# Patient Record
Sex: Female | Born: 1960 | Race: White | Hispanic: No | Marital: Single
Health system: Midwestern US, Community
[De-identification: ages and names within clinical notes are randomized; demographics above are authoritative.]

## PROBLEM LIST (undated history)

## (undated) DIAGNOSIS — R55 Syncope and collapse: Secondary | ICD-10-CM

## (undated) DIAGNOSIS — I7300 Raynaud's syndrome without gangrene: Secondary | ICD-10-CM

## (undated) DIAGNOSIS — K219 Gastro-esophageal reflux disease without esophagitis: Secondary | ICD-10-CM

## (undated) DIAGNOSIS — E079 Disorder of thyroid, unspecified: Secondary | ICD-10-CM

---
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
## 1988-07-08 HISTORY — PX: TUBAL LIGATION: SHX77

## 1998-07-08 HISTORY — PX: ABDOMINAL HYSTERECTOMY: SHX81

## 2013-04-21 ENCOUNTER — Encounter (HOSPITAL_COMMUNITY): Payer: Self-pay | Admitting: Emergency Medicine

## 2013-04-21 ENCOUNTER — Emergency Department (INDEPENDENT_AMBULATORY_CARE_PROVIDER_SITE_OTHER)
Admission: EM | Admit: 2013-04-21 | Discharge: 2013-04-21 | Disposition: A | Discharge status: Home / Self Care | Payer: Self-pay | Source: Home / Self Care | Attending: Emergency Medicine | Admitting: Emergency Medicine

## 2013-04-21 ENCOUNTER — Other Ambulatory Visit (HOSPITAL_COMMUNITY)
Admission: RE | Admit: 2013-04-21 | Discharge: 2013-04-21 | Disposition: A | Discharge status: Home / Self Care | Payer: Self-pay | Source: Ambulatory Visit | Attending: Emergency Medicine | Admitting: Emergency Medicine

## 2013-04-21 DIAGNOSIS — N760 Acute vaginitis: Secondary | ICD-10-CM

## 2013-04-21 DIAGNOSIS — J209 Acute bronchitis, unspecified: Secondary | ICD-10-CM

## 2013-04-21 DIAGNOSIS — Z113 Encounter for screening for infections with a predominantly sexual mode of transmission: Secondary | ICD-10-CM | POA: Insufficient documentation

## 2013-04-21 DIAGNOSIS — J019 Acute sinusitis, unspecified: Secondary | ICD-10-CM

## 2013-04-21 DIAGNOSIS — J069 Acute upper respiratory infection, unspecified: Secondary | ICD-10-CM

## 2013-04-21 DIAGNOSIS — F17210 Nicotine dependence, cigarettes, uncomplicated: Secondary | ICD-10-CM

## 2013-04-21 MED ORDER — GUAIFENESIN-CODEINE 100-10 MG/5ML PO SYRP: 10.0000 mL | ORAL_SOLUTION | Freq: Four times a day (QID) | ORAL | Status: DC | PRN

## 2013-04-21 MED ORDER — AMOXICILLIN 500 MG PO CAPS: 1000.0000 mg | ORAL_CAPSULE | Freq: Three times a day (TID) | ORAL | Status: DC

## 2013-04-21 MED ORDER — METRONIDAZOLE 500 MG PO TABS: 500.0000 mg | ORAL_TABLET | Freq: Two times a day (BID) | ORAL | Status: DC

## 2013-04-21 MED ORDER — FLUCONAZOLE 150 MG PO TABS: 150.0000 mg | ORAL_TABLET | Freq: Once | ORAL | Status: DC

## 2013-04-21 NOTE — ED Provider Notes (Signed)
Chief Complaint:   Chief Complaint  Patient presents with  . Sinusitis  . Vaginitis    History of Present Illness:   Monica Wilkinson is a 52 year old female who presents with cough and vaginal discharge.  1. Cough: She's had a two-day history of cough productive yellow-green sputum, wheezing, aching in her upper back. She is a smoker. No history of asthma. She's also had sore throat, chills, nasal congestion with yellow drainage, headache, sinus pressure, and nausea. She denies any fever, vomiting, or diarrhea. No known sick exposures.  2. Vaginal discharge: She's also had a one-week history of vaginal discharge, slight itching, and slight odor. She's status post hysterectomy. Denies any pelvic pain.  Review of Systems:  Other than noted above, the patient denies any of the following symptoms: Systemic:  No fever, chills, sweats, or weight loss. GI:  No abdominal pain, nausea, anorexia, vomiting, diarrhea, constipation, melena or hematochezia. GU:  No dysuria, frequency, urgency, hematuria, vaginal discharge, itching, or abnormal vaginal bleeding. Skin:  No rash or itching.  PMFSH:  Past medical history, family history, social history, meds, and allergies were reviewed.  She takes Prilosec and Synthroid and has a history of gastroesophageal reflux and hypothyroidism.  Physical Exam:   Vital signs:  BP 118/50  Pulse 86  Temp(Src) 97.6 F (36.4 C) (Oral)  Resp 18  SpO2 100% General:  Alert, oriented and in no distress. HEENT: TMs are normal. Nasal mucosa was normal. Pharynx was erythematous with some cobblestoning no exudate or drainage. No cervical lymphadenopathy. Lungs:  Breath sounds clear and equal bilaterally.  No wheezes, rales or rhonchi. Heart:  Regular rhythm.  No gallops or murmers. Abdomen:  Soft, flat and non-distended.  No organomegaly or mass.  No tenderness, guarding or rebound.  Bowel sounds normally active. Pelvic exam:  Normal external genitalia. Vaginal mucosa was  normal. There was a small amount of white, malodorous discharge. Cervix and uterus were surgically absent. No adnexal masses or tenderness. DNA probe for gonorrhea, Chlamydia, Trichomonas, Gardnerella, Candida were obtained. Skin:  Clear, warm and dry.  Assessment:  The primary encounter diagnosis was Acute bronchitis. Diagnoses of Acute sinusitis, Viral URI, Cigarette smoker, and Vaginitis were also pertinent to this visit.   she was strongly encouraged to quit smoking.   Plan:   1.  Meds:  The following meds were prescribed:   Discharge Medication List as of 04/21/2013  2:03 PM    START taking these medications   Details  amoxicillin (AMOXIL) 500 MG capsule Take 2 capsules (1,000 mg total) by mouth 3 (three) times daily., Starting 04/21/2013, Until Discontinued, Normal    fluconazole (DIFLUCAN) 150 MG tablet Take 1 tablet (150 mg total) by mouth once., Starting 04/21/2013, Normal    guaiFENesin-codeine (GUIATUSS AC) 100-10 MG/5ML syrup Take 10 mLs by mouth 4 (four) times daily as needed for cough., Starting 04/21/2013, Until Discontinued, Print    metroNIDAZOLE (FLAGYL) 500 MG tablet Take 1 tablet (500 mg total) by mouth 2 (two) times daily., Starting 04/21/2013, Until Discontinued, Normal        2.  Patient Education/Counseling:  The patient was given appropriate handouts, self care instructions, and instructed in symptomatic relief.  Strongly encouraged to quit smoking and given handouts about how to quit.   3.  Follow up:  The patient was told to follow up if no better in 3 to 4 days, if becoming worse in any way, and given some red flag symptoms such as difficulty breathing or fever which would  prompt immediate return.  Follow up here as needed.     Reuben Likes, MD 04/21/13 [phone]

## 2013-04-21 NOTE — ED Notes (Signed)
C/o sinus issues which started yesterday and yeast infection a few days ago.  No treatments tried for either issue.

## 2013-04-23 NOTE — ED Notes (Signed)
GC/Chlamydia neg., Affirm: Candida neg., Gardnerella and Trich pos. Pt. Adequately treated with Flagyl.  Needs notified. Monica Wilkinson 04/23/2013

## 2013-04-24 ENCOUNTER — Telehealth (HOSPITAL_COMMUNITY): Payer: Self-pay | Admitting: *Deleted

## 2013-04-24 NOTE — ED Notes (Signed)
I called pt. and left a message to call. Monica Wilkinson 04/24/2013

## 2013-04-26 ENCOUNTER — Telehealth (HOSPITAL_COMMUNITY): Payer: Self-pay | Admitting: *Deleted

## 2013-04-26 NOTE — ED Notes (Signed)
I called and left a message to call. Call 2. Vassie Moselle 04/26/2013

## 2013-04-27 NOTE — ED Notes (Signed)
Pt. called back.  Pt. verified x 2 and given results.  Pt. told she was adequately treated for both infections with Flagyl.  Pt. instructed to notify her partner to be treated with Flagyl, no sex until you have finished your medication and your partner has been treated and to practice safe sex. Pt. asked where he can be treated. I told her if he does not have a PCP, he can come here.  Pt. voiced understanding. Vassie Moselle 04/27/2013

## 2014-01-18 ENCOUNTER — Emergency Department (HOSPITAL_COMMUNITY)
Admission: EM | Admit: 2014-01-18 | Discharge: 2014-01-18 | Disposition: A | Discharge status: Home / Self Care | Payer: Self-pay | Attending: Emergency Medicine | Admitting: Emergency Medicine

## 2014-01-18 ENCOUNTER — Encounter (HOSPITAL_COMMUNITY): Payer: Self-pay | Admitting: Emergency Medicine

## 2014-01-18 ENCOUNTER — Emergency Department (HOSPITAL_COMMUNITY): Payer: Self-pay

## 2014-01-18 DIAGNOSIS — R059 Cough, unspecified: Secondary | ICD-10-CM

## 2014-01-18 DIAGNOSIS — Z9089 Acquired absence of other organs: Secondary | ICD-10-CM | POA: Insufficient documentation

## 2014-01-18 DIAGNOSIS — M25579 Pain in unspecified ankle and joints of unspecified foot: Secondary | ICD-10-CM | POA: Insufficient documentation

## 2014-01-18 DIAGNOSIS — E079 Disorder of thyroid, unspecified: Secondary | ICD-10-CM | POA: Insufficient documentation

## 2014-01-18 DIAGNOSIS — R600 Localized edema: Secondary | ICD-10-CM

## 2014-01-18 DIAGNOSIS — Z90710 Acquired absence of both cervix and uterus: Secondary | ICD-10-CM | POA: Insufficient documentation

## 2014-01-18 DIAGNOSIS — Z79899 Other long term (current) drug therapy: Secondary | ICD-10-CM | POA: Insufficient documentation

## 2014-01-18 DIAGNOSIS — F17200 Nicotine dependence, unspecified, uncomplicated: Secondary | ICD-10-CM | POA: Insufficient documentation

## 2014-01-18 DIAGNOSIS — Y929 Unspecified place or not applicable: Secondary | ICD-10-CM | POA: Insufficient documentation

## 2014-01-18 DIAGNOSIS — A5901 Trichomonal vulvovaginitis: Secondary | ICD-10-CM

## 2014-01-18 DIAGNOSIS — R05 Cough: Secondary | ICD-10-CM | POA: Insufficient documentation

## 2014-01-18 DIAGNOSIS — X58XXXA Exposure to other specified factors, initial encounter: Secondary | ICD-10-CM | POA: Insufficient documentation

## 2014-01-18 DIAGNOSIS — R609 Edema, unspecified: Secondary | ICD-10-CM | POA: Insufficient documentation

## 2014-01-18 DIAGNOSIS — R109 Unspecified abdominal pain: Secondary | ICD-10-CM

## 2014-01-18 DIAGNOSIS — Y939 Activity, unspecified: Secondary | ICD-10-CM | POA: Insufficient documentation

## 2014-01-18 DIAGNOSIS — Z9851 Tubal ligation status: Secondary | ICD-10-CM | POA: Insufficient documentation

## 2014-01-18 DIAGNOSIS — IMO0002 Reserved for concepts with insufficient information to code with codable children: Secondary | ICD-10-CM | POA: Insufficient documentation

## 2014-01-18 HISTORY — DX: Disorder of thyroid, unspecified: E07.9

## 2014-01-18 LAB — CBC WITH DIFFERENTIAL/PLATELET
Basophils Absolute: 0 10*3/uL (ref 0.0–0.1)
Basophils Relative: 1 % (ref 0–1)
EOS PCT: 4 % (ref 0–5)
Eosinophils Absolute: 0.2 10*3/uL (ref 0.0–0.7)
HEMATOCRIT: 38.1 % (ref 36.0–46.0)
HEMOGLOBIN: 12.4 g/dL (ref 12.0–15.0)
LYMPHS PCT: 43 % (ref 12–46)
Lymphs Abs: 2.4 10*3/uL (ref 0.7–4.0)
MCH: 31.1 pg (ref 26.0–34.0)
MCHC: 32.5 g/dL (ref 30.0–36.0)
MCV: 95.5 fL (ref 78.0–100.0)
MONO ABS: 0.5 10*3/uL (ref 0.1–1.0)
MONOS PCT: 9 % (ref 3–12)
Neutro Abs: 2.4 10*3/uL (ref 1.7–7.7)
Neutrophils Relative %: 43 % (ref 43–77)
Platelets: 202 10*3/uL (ref 150–400)
RBC: 3.99 MIL/uL (ref 3.87–5.11)
RDW: 12.3 % (ref 11.5–15.5)
WBC: 5.5 10*3/uL (ref 4.0–10.5)

## 2014-01-18 LAB — URINALYSIS, ROUTINE W REFLEX MICROSCOPIC
BILIRUBIN URINE: NEGATIVE
GLUCOSE, UA: NEGATIVE mg/dL
Hgb urine dipstick: NEGATIVE
KETONES UR: NEGATIVE mg/dL
Nitrite: NEGATIVE
PH: 6.5 (ref 5.0–8.0)
Protein, ur: NEGATIVE mg/dL
SPECIFIC GRAVITY, URINE: 1.016 (ref 1.005–1.030)
Urobilinogen, UA: 0.2 mg/dL (ref 0.0–1.0)

## 2014-01-18 LAB — BASIC METABOLIC PANEL
Anion gap: 14 (ref 5–15)
BUN: 9 mg/dL (ref 6–23)
CALCIUM: 9.8 mg/dL (ref 8.4–10.5)
CHLORIDE: 103 meq/L (ref 96–112)
CO2: 26 meq/L (ref 19–32)
Creatinine, Ser: 0.68 mg/dL (ref 0.50–1.10)
GFR calc Af Amer: >90 mL/min (ref >90)
GFR calc non Af Amer: >90 mL/min (ref >90)
GLUCOSE: 94 mg/dL (ref 70–99)
Potassium: 3.8 mEq/L (ref 3.7–5.3)
Sodium: 143 mEq/L (ref 137–147)

## 2014-01-18 LAB — WET PREP, GENITAL
Clue Cells Wet Prep HPF POC: NONE SEEN
Yeast Wet Prep HPF POC: NONE SEEN

## 2014-01-18 LAB — URINE MICROSCOPIC-ADD ON

## 2014-01-18 MED ORDER — LIDOCAINE HCL 2 % IJ SOLN
INTRAMUSCULAR | Status: AC
  Filled 2014-01-18: qty 20

## 2014-01-18 MED ORDER — AZITHROMYCIN 250 MG PO TABS
1000.0000 mg | ORAL_TABLET | Freq: Once | ORAL | Status: AC
  Administered 2014-01-18: 1000 mg via ORAL
  Filled 2014-01-18: qty 4

## 2014-01-18 MED ORDER — CEFTRIAXONE SODIUM 250 MG IJ SOLR
250.0000 mg | Freq: Once | INTRAMUSCULAR | Status: AC
  Administered 2014-01-18: 250 mg via INTRAMUSCULAR
  Filled 2014-01-18: qty 250

## 2014-01-18 MED ORDER — METRONIDAZOLE 500 MG PO TABS
2000.0000 mg | ORAL_TABLET | Freq: Once | ORAL | Status: AC
  Administered 2014-01-18: 2000 mg via ORAL
  Filled 2014-01-18: qty 4

## 2014-01-18 NOTE — ED Notes (Signed)
Pt c/o increasing bilateral foot swelling x 2 and vaginal discharge x 1 week.  Pain score 7/10.  Denies injury to feet.  Sts discharge is "yellowish" with a slight odor.  Pt reports "the only time I've had my feet swell like this was when I had a really bad sunburn."

## 2014-01-18 NOTE — Discharge Instructions (Signed)
Avoid tobacco use  Avoid sexual contact until your sexual partner is treated and you know your results  Elevate your lower extremities and obtain US tomorrow at [facility]  Return to the emergency department if you develop any changing/worsening condition, worsening abdominal pain, repeated vomiting, difficulty breathing, coughing up blood, fever or any other concerns (please read additional information regarding your condition below)   Trichomonas Test This is a test used to diagnose an infection with Trichomonas vaginalis. This is a sexually transmitted, microscopic parasite that causes vaginal infections in women and urethritis in some men. This test is often done if there is vaginal discharge or pain on urination. If you have an infection with another sexually transmitted disease, your caregiver might test for trichomonas as well. Secretions or a sample are collected on a swab and are examined under a microscope or cultured to detect the presence of Trichomonas vaginalis. Trichomonas is one of the most common sexually transmitted diseases. An infected person is at greater risk of getting other sexually transmitted diseases, so your caregiver may want to test for these other infections also.  Trichomonas infection can affect pregnancy, contributing to premature birth and low birth weight. You should inform your physician if you may be pregnant. The doctor may medically manage a woman who is infected and in her first three months of pregnancy differently.  SPECIMEN COLLECTION In women, a swab of secretions is collected from the vagina. In men, a swab is inserted into the urethra. MEANING OF TEST  A positive test indicates an active infection that requires treatment with antibiotics. It is usually treated with an antibiotic called metronidazole. All current sexual partners must be treated at the same time or the patient is likely to become re-infected. The Center for Disease Control (CDC) recommends  the following guidelines to avoid infection or reinfection:   Abstain from sexual intercourse  Use a latex condom properly, every time you have sexual intercourse, with every partner.  Limit your sexual partners. The more sex partners you have, the greater your risk of encountering someone who has this or other STI's.  If you are infected, your sexual partner(s) should be treated. This will prevent you from getting reinfected. OBTAINING THE TEST RESULTS It is your responsibility to obtain your test results. Ask the lab or department performing the test when and how you will get your results. Document Released: 07/27/2004 Document Revised: 10/19/2012 Document Reviewed: 04/03/2005 [facility] Patient Information 2015 [place], [place]. This information is not intended to replace advice given to you by your health care provider. Make sure you discuss any questions you have with your health care provider.   Abdominal Pain, Women Abdominal (stomach, pelvic, or belly) pain can be caused by many things. It is important to tell your doctor:  The location of the pain.  Does it come and go or is it present all the time?  Are there things that start the pain (eating certain foods, exercise)?  Are there other symptoms associated with the pain (fever, nausea, vomiting, diarrhea)? All of this is helpful to know when trying to find the cause of the pain. CAUSES   Stomach: virus or bacteria infection, or ulcer.  Intestine: appendicitis (inflamed appendix), regional ileitis (Crohn's disease), ulcerative colitis (inflamed colon), irritable bowel syndrome, diverticulitis (inflamed diverticulum of the colon), or cancer of the stomach or intestine.  Gallbladder disease or stones in the gallbladder.  Kidney disease, kidney stones, or infection.  Pancreas infection or cancer.  Fibromyalgia (pain disorder).  Diseases of the female organs:  Uterus: fibroid (non-cancerous) tumors or  infection.  Fallopian tubes: infection or tubal pregnancy.  Ovary: cysts or tumors.  Pelvic adhesions (scar tissue).  Endometriosis (uterus lining tissue growing in the pelvis and on the pelvic organs).  Pelvic congestion syndrome (female organs filling up with blood just before the menstrual period).  Pain with the menstrual period.  Pain with ovulation (producing an egg).  Pain with an IUD (intrauterine device, birth control) in the uterus.  Cancer of the female organs.  Functional pain (pain not caused by a disease, may improve without treatment).  Psychological pain.  Depression. DIAGNOSIS  Your doctor will decide the seriousness of your pain by doing an examination.  Blood tests.  X-rays.  Ultrasound.  CT scan (computed tomography, special type of X-ray).  MRI (magnetic resonance imaging).  Cultures, for infection.  Barium enema (dye inserted in the large intestine, to better view it with X-rays).  Colonoscopy (looking in intestine with a lighted tube).  Laparoscopy (minor surgery, looking in abdomen with a lighted tube).  Major abdominal exploratory surgery (looking in abdomen with a large incision). TREATMENT  The treatment will depend on the cause of the pain.   Many cases can be observed and treated at home.  Over-the-counter medicines recommended by your caregiver.  Prescription medicine.  Antibiotics, for infection.  Birth control pills, for painful periods or for ovulation pain.  Hormone treatment, for endometriosis.  Nerve blocking injections.  Physical therapy.  Antidepressants.  Counseling with a psychologist or psychiatrist.  Minor or major surgery. HOME CARE INSTRUCTIONS   Do not take laxatives, unless directed by your caregiver.  Take over-the-counter pain medicine only if ordered by your caregiver. Do not take aspirin because it can cause an upset stomach or bleeding.  Try a clear liquid diet (broth or water) as ordered  by your caregiver. Slowly move to a bland diet, as tolerated, if the pain is related to the stomach or intestine.  Have a thermometer and take your temperature several times a day, and record it.  Bed rest and sleep, if it helps the pain.  Avoid sexual intercourse, if it causes pain.  Avoid stressful situations.  Keep your follow-up appointments and tests, as your caregiver orders.  If the pain does not go away with medicine or surgery, you may try:  Acupuncture.  Relaxation exercises (yoga, meditation).  Group therapy.  Counseling. SEEK MEDICAL CARE IF:   You notice certain foods cause stomach pain.  Your home care treatment is not helping your pain.  You need stronger pain medicine.  You want your IUD removed.  You feel faint or lightheaded.  You develop nausea and vomiting.  You develop a rash.  You are having side effects or an allergy to your medicine. SEEK IMMEDIATE MEDICAL CARE IF:   Your pain does not go away or gets worse.  You have a fever.  Your pain is felt only in portions of the abdomen. The right side could possibly be appendicitis. The left lower portion of the abdomen could be colitis or diverticulitis.  You are passing blood in your stools (bright red or black tarry stools, with or without vomiting).  You have blood in your urine.  You develop chills, with or without a fever.  You pass out. MAKE SURE YOU:   Understand these instructions.  Will watch your condition.  Will get help right away if you are not doing well or get worse. Document Released: 04/21/2007 Document  Revised: 09/16/2011 Document Reviewed: 05/11/2009 [employer] Patient Information 2015 [place], [place]. This information is not intended to replace advice given to you by your health care provider. Make sure you discuss any questions you have with your health care provider.  Peripheral Edema You have swelling in your legs (peripheral edema). This swelling is due to excess  accumulation of salt and water in your body. Edema may be a sign of heart, kidney or liver disease, or a side effect of a medication. It may also be due to problems in the leg veins. Elevating your legs and using special support stockings may be very helpful, if the cause of the swelling is due to poor venous circulation. Avoid long periods of standing, whatever the cause. Treatment of edema depends on identifying the cause. Chips, pretzels, pickles and other salty foods should be avoided. Restricting salt in your diet is almost always needed. Water pills (diuretics) are often used to remove the excess salt and water from your body via urine. These medicines prevent the kidney from reabsorbing sodium. This increases urine flow. Diuretic treatment may also result in lowering of potassium levels in your body. Potassium supplements may be needed if you have to use diuretics daily. Daily weights can help you keep track of your progress in clearing your edema. You should call your caregiver for follow up care as recommended. SEEK IMMEDIATE MEDICAL CARE IF:   You have increased swelling, pain, redness, or heat in your legs.  You develop shortness of breath, especially when lying down.  You develop chest or abdominal pain, weakness, or fainting.  You have a fever. Document Released: 08/01/2004 Document Revised: 09/16/2011 Document Reviewed: 07/12/2009 [facility] Patient Information 2015 [place], [place]. This information is not intended to replace advice given to you by your health care provider. Make sure you discuss any questions you have with your health care provider.  Cough, Adult  A cough is a reflex that helps clear your throat and airways. It can help heal the body or may be a reaction to an irritated airway. A cough may only last 2 or 3 weeks (acute) or may last more than 8 weeks (chronic).  CAUSES Acute cough:  Viral or bacterial infections. Chronic  cough:  Infections.  Allergies.  Asthma.  Post-nasal drip.  Smoking.  Heartburn or acid reflux.  Some medicines.  Chronic lung problems (COPD).  Cancer. SYMPTOMS   Cough.  Fever.  Chest pain.  Increased breathing rate.  High-pitched whistling sound when breathing (wheezing).  Colored mucus that you cough up (sputum). TREATMENT   A bacterial cough may be treated with antibiotic medicine.  A viral cough must run its course and will not respond to antibiotics.  Your caregiver may recommend other treatments if you have a chronic cough. HOME CARE INSTRUCTIONS   Only take over-the-counter or prescription medicines for pain, discomfort, or fever as directed by your caregiver. Use cough suppressants only as directed by your caregiver.  Use a cold steam vaporizer or humidifier in your bedroom or home to help loosen secretions.  Sleep in a semi-upright position if your cough is worse at night.  Rest as needed.  Stop smoking if you smoke. SEEK IMMEDIATE MEDICAL CARE IF:   You have pus in your sputum.  Your cough starts to worsen.  You cannot control your cough with suppressants and are losing sleep.  You begin coughing up blood.  You have difficulty breathing.  You develop pain which is getting worse or is uncontrolled with medicine.  You have a fever. MAKE SURE YOU:   Understand these instructions.  Will watch your condition.  Will get help right away if you are not doing well or get worse. Document Released: 12/21/2010 Document Revised: 09/16/2011 Document Reviewed: 12/21/2010 [facility] Patient Information 2015 [place]. This information is not intended to replace advice given to you by your health care provider. Make sure you discuss any questions you have with your health care provider.

## 2014-01-18 NOTE — ED Notes (Signed)
Pelvic setup at bedside.

## 2014-01-18 NOTE — ED Provider Notes (Signed)
CSN: [id number]     Arrival date & time 01/18/14  1752 History   First MD Initiated Contact with Patient 01/18/14 1903     Chief Complaint  Patient presents with  . Foot Swelling  . Vaginal Discharge   HPI  Monica Wilkinson is a 53 y.o. female with a PMH of thyroid disease who presents to the ED for evaluation of foot swelling and vaginal discharge.   History was provided by the patient. Patient states she has had bilateral foot swelling for the past 2 days (L>R). 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 She also complains of a deep pain, which radiates up into her calves. No weakness, loss of sensation, numbness/tingling, skin changes. Has abrasion to left back heel with no discharge, bleeding, or open wounds. No history of DVT, PE, clotting disorder, cancer, recent surgery, immobilization, travel, or estrogen supplementation. Patient also complains of a non-productive cough for the past two weeks. No chest pain, wheezing or SOB. She states she feels congested. Patient is a tobacco user.   Patient also complains of yellow vaginal discharge for the past week. She also reports a slight odor. Has hx of abdominal hysterectomy. Also has had intermittent cramping mild lower middle pelvic pain, which radiates to her lower back. She denies any dysuria, hematuria, diarrhea, constipation, emesis, nausea, fevers, chills, change in appetite/activity. No new sexual partners. No concerns for STD's.    Past Medical History  Diagnosis Date  . Thyroid disease    Past Surgical History  Procedure Laterality Date  . Cholecystectomy    . Ovarian cyst drainage    . Abdominal hysterectomy    . Tubal ligation     History reviewed. No pertinent family history. History  Substance Use Topics  . Smoking status: Current Every Day Smoker -- 0.50 packs/day     Types: Cigarettes  . Smokeless tobacco: Not on file  . Alcohol Use: Yes     Comment: occ   OB History   Grav Para Term Preterm Abortions TAB SAB Ect Mult Living                 Review of Systems  Constitutional: Negative for fever, chills, diaphoresis, activity change, appetite change and fatigue.  HENT: Negative for congestion, rhinorrhea and sore throat.   Respiratory: Positive for cough. Negative for chest tightness, shortness of breath and wheezing.   Cardiovascular: Positive for leg swelling. Negative for chest pain and palpitations.  Gastrointestinal: Positive for abdominal pain. Negative for nausea, vomiting, diarrhea and constipation.  Genitourinary: Positive for vaginal discharge and pelvic pain. Negative for dysuria, urgency, hematuria, flank pain, decreased urine volume, vaginal bleeding, difficulty urinating and vaginal pain.  Musculoskeletal: Positive for arthralgias (feet bilaterally), back pain and joint swelling (left ankle). Negative for gait problem, myalgias and neck pain.  Skin: Positive for wound. Negative for color change.  Neurological: Negative for dizziness, weakness, light-headedness, numbness and headaches.    Allergies  Review of patient's allergies indicates no known allergies.  Home Medications   Prior to Admission medications   Medication Sig Start Date End Date Taking? Authorizing Provider  levothyroxine (SYNTHROID, LEVOTHROID) 88 MCG tablet Take 88 mcg by mouth daily before breakfast.   Yes Historical Provider, MD   BP 114/75  Pulse 75  Temp(Src) 98.7 F (37.1 C) (Oral)  Resp 16  SpO2 100%  Filed Vitals:   01/18/14 1825 01/18/14 2205  BP: 114/75 118/61  Pulse: 75 72  Temp: 98.7 F (37.1 C) 99.1 F (37.3 C)  TempSrc: Oral Oral  Resp: 16 16  SpO2: 100% 100%    Physical Exam  Nursing note and vitals reviewed. Constitutional: She is oriented to person, place, and time. She appears well-developed and well-nourished. No distress.  HENT:   Head: Normocephalic and atraumatic.  Right Ear: External ear normal.  Left Ear: External ear normal.  Nose: Nose normal.  Mouth/Throat: Oropharynx is clear and moist. No oropharyngeal exudate.  Eyes: Conjunctivae are normal. Right eye exhibits no discharge. Left eye exhibits no discharge.  Neck: Normal range of motion. Neck supple.  Cardiovascular: Normal rate, regular rhythm, normal heart sounds and intact distal pulses.  Exam reveals no gallop and no friction rub.   No murmur heard. Dorsalis pedis pulses present and equal bilaterally  Pulmonary/Chest: Effort normal and breath sounds normal. No respiratory distress. She has no wheezes. She has no rales. She exhibits no tenderness.  Abdominal: Soft. Bowel sounds are normal. She exhibits no distension. There is no tenderness. There is no rebound and no guarding.  Genitourinary:  Thick yellow moderate amount of vaginal discharge present in the vaginal vault. Cervix not visualized. No palpable masses or tenderness on bimanual exam. Normal external genitalia. No genital sores.   Musculoskeletal: Normal range of motion. She exhibits edema and tenderness.  Pitting 1+ edema to the left lateral ankle and dorsum of foot with no ecchymosis, erythema, warmth or wounds. ROM intact in the LE. No tenderness to palpation in the LE throughout. No calf tenderness or edema bilaterally. Mild non-pitting edema to the right foot.   Neurological: She is alert and oriented to person, place, and time.  Sensation intact in the LE   Skin: Skin is warm and dry. She is not diaphoretic.  Abrasion to the left posterior heel with no surrounding edema or erythema. No open wound or drainage.      ED Course  Procedures (including critical care time) Labs Review Labs Reviewed - No data to display  Imaging Review Dg Chest 2 View  01/18/2014   CLINICAL DATA:  Shortness of breath  EXAM: CHEST  2 VIEW  COMPARISON:  07/06/2013  FINDINGS: Cardiac shadow is within normal  limits. Bilateral breast implants are again noted. The lungs are hyperinflated consistent with COPD. Nipple shadows are noted bilaterally. No acute bony abnormality is seen.  IMPRESSION: No acute abnormality noted.   Electronically Signed   By: Alcide Clever M.D.   On: 01/18/2014 20:28   Dg Ankle Complete Left  01/18/2014   CLINICAL DATA:  Left foot and ankle pain following injury  EXAM: LEFT ANKLE COMPLETE - 3+ VIEW  COMPARISON:  None.  FINDINGS: Generalized soft tissue swelling is noted. There is a well corticated density adjacent to the medial malleolus consistent with prior trauma. No acute fracture or dislocation is seen.  IMPRESSION: Soft tissue changes without acute abnormality.   Electronically Signed   By: Alcide Clever M.D.   On: 01/18/2014 20:27   Dg Foot Complete Left  01/18/2014   CLINICAL DATA:  Swelling, no injury.  EXAM: LEFT FOOT - COMPLETE 3+ VIEW  COMPARISON:  None.  FINDINGS: No acute fracture deformity or dislocation. Tiny corticated fragment inferior to the medial malleolus likely reflects remote injury. Os perineum. Joint space intact without erosions. No destructive bony lesions. Mild soft tissue swelling without subcutaneous gas or  radiopaque foreign bodies.  IMPRESSION: Soft tissue swelling could reflect cellulitis without fracture deformity or dislocation.   Electronically Signed   By: Awilda Metroourtnay  Bloomer   On: 01/18/2014 20:28     EKG Interpretation None      Results for orders placed during the hospital encounter of 01/18/14  WET PREP, GENITAL      Result Value Ref Range   Yeast Wet Prep HPF POC NONE SEEN  NONE SEEN   Trich, Wet Prep FEW (*) NONE SEEN   Clue Cells Wet Prep HPF POC NONE SEEN  NONE SEEN   WBC, Wet Prep HPF POC TOO NUMEROUS TO COUNT (*) NONE SEEN  CBC WITH DIFFERENTIAL      Result Value Ref Range   WBC 5.5  4.0 - 10.5 K/uL   RBC 3.99  3.87 - 5.11 MIL/uL   Hemoglobin 12.4  12.0 - 15.0 g/dL   HCT 11.938.1  14.736.0 - 82.946.0 %   MCV 95.5  78.0 - 100.0 fL   MCH  31.1  26.0 - 34.0 pg   MCHC 32.5  30.0 - 36.0 g/dL   RDW 56.212.3  13.011.5 - 86.515.5 %   Platelets 202  150 - 400 K/uL   Neutrophils Relative % 43  43 - 77 %   Neutro Abs 2.4  1.7 - 7.7 K/uL   Lymphocytes Relative 43  12 - 46 %   Lymphs Abs 2.4  0.7 - 4.0 K/uL   Monocytes Relative 9  3 - 12 %   Monocytes Absolute 0.5  0.1 - 1.0 K/uL   Eosinophils Relative 4  0 - 5 %   Eosinophils Absolute 0.2  0.0 - 0.7 K/uL   Basophils Relative 1  0 - 1 %   Basophils Absolute 0.0  0.0 - 0.1 K/uL  BASIC METABOLIC PANEL      Result Value Ref Range   Sodium 143  137 - 147 mEq/L   Potassium 3.8  3.7 - 5.3 mEq/L   Chloride 103  96 - 112 mEq/L   CO2 26  19 - 32 mEq/L   Glucose, Bld 94  70 - 99 mg/dL   BUN 9  6 - 23 mg/dL   Creatinine, Ser 7.840.68  0.50 - 1.10 mg/dL   Calcium 9.8  8.4 - 69.610.5 mg/dL   GFR calc non Af Amer >90  >90 mL/min   GFR calc Af Amer >90  >90 mL/min   Anion gap 14  5 - 15  URINALYSIS, ROUTINE W REFLEX MICROSCOPIC      Result Value Ref Range   Color, Urine YELLOW  YELLOW   APPearance CLEAR  CLEAR   Specific Gravity, Urine 1.016  1.005 - 1.030   pH 6.5  5.0 - 8.0   Glucose, UA NEGATIVE  NEGATIVE mg/dL   Hgb urine dipstick NEGATIVE  NEGATIVE   Bilirubin Urine NEGATIVE  NEGATIVE   Ketones, ur NEGATIVE  NEGATIVE mg/dL   Protein, ur NEGATIVE  NEGATIVE mg/dL   Urobilinogen, UA 0.2  0.0 - 1.0 mg/dL   Nitrite NEGATIVE  NEGATIVE   Leukocytes, UA LARGE (*) NEGATIVE  URINE MICROSCOPIC-ADD ON      Result Value Ref Range   Squamous Epithelial / LPF RARE  RARE   WBC, UA 3-6  <3 WBC/hpf   RBC / HPF 0-2  <3 RBC/hpf   Bacteria, UA RARE  RARE     MDM   Monica Wilkinson is a 53 y.o. female with a PMH of thyroid  disease who presents to the ED for evaluation of foot swelling and vaginal discharge.   Vaginal discharge likely due to trichomonas infection. Treated patient with 2 grams flagyl in the ED. Does not have insurance or money to pay for medications. Patient also treated with Azithromycin and  Rocephin. STD testing pending. Also complained of intermittent middle lower abdominal pain. Abdominal exam benign with no tenderness on exam. Pelvic exam unremarkable. Patient instructed to follow-up with women's health. Advised to void sexual intercourse. UA not highly suggestive of a UTI.   Patient also complained of bilateral leg edema with the left worse than the right. Swelling may be due to increase in recent activity. Patient neurovascularly intact. X-rays negative for fracture or malalignment. No evidence of cellulitis with warmth or redness. Patient afebrile and non-toxic in appearance. No leukocytosis. Although patient has no risks factors for this, DVT cannot be excluded. Bilateral US ordered. Suspecion for this is low, however, patient instructed to return to the ED [facility]) for this tomorrow. Patient also complained of a cough. Likely chronic from tobacco use. Chest x-ray negative for an acute cardiopulmonary process, however, does show hyperinflation consistent with COPD. No chest pain or SOB. No hypoxia, respiratory distress, or tachypnea. Patient instructed to avoid tobacco smoke and follow-up with her PCP. Return precautions, discharge instructions, and follow-up was discussed with the patient before discharge.     Discharge Medication List as of 01/18/2014 10:33 PM      Final impressions: 1. Leg edema, left   2. Trichomonas vaginitis   3. Cough   4. Abdominal pain, unspecified abdominal location      Luiz Iron PA-C   This patient was discussed with Dr. Phill Myron, PA-C 01/19/14 [phone]

## 2014-01-19 ENCOUNTER — Encounter (HOSPITAL_COMMUNITY): Payer: Self-pay

## 2014-01-19 LAB — GC/CHLAMYDIA PROBE AMP
CT Probe RNA: NEGATIVE
GC Probe RNA: NEGATIVE

## 2014-01-19 LAB — HIV ANTIBODY (ROUTINE TESTING W REFLEX): HIV 1&2 Ab, 4th Generation: NONREACTIVE

## 2014-01-19 LAB — RPR

## 2014-01-20 NOTE — ED Provider Notes (Signed)
Medical screening examination/treatment/procedure(s) were performed by non-physician practitioner and as supervising physician I was immediately available for consultation/collaboration.   EKG Interpretation None        Daryus Sowash M Nakiya Rallis, DO 01/20/14 1433 

## 2014-10-05 ENCOUNTER — Encounter (HOSPITAL_COMMUNITY): Payer: Self-pay | Admitting: Emergency Medicine

## 2014-10-05 ENCOUNTER — Observation Stay (HOSPITAL_COMMUNITY)
Admission: EM | Admit: 2014-10-05 | Discharge: 2014-10-06 | Disposition: A | Discharge status: Home / Self Care | Payer: Self-pay | Attending: Internal Medicine | Admitting: Internal Medicine

## 2014-10-05 ENCOUNTER — Emergency Department (HOSPITAL_COMMUNITY): Payer: Self-pay

## 2014-10-05 DIAGNOSIS — Y990 Civilian activity done for income or pay: Secondary | ICD-10-CM | POA: Insufficient documentation

## 2014-10-05 DIAGNOSIS — W1839XA Other fall on same level, initial encounter: Secondary | ICD-10-CM | POA: Insufficient documentation

## 2014-10-05 DIAGNOSIS — R079 Chest pain, unspecified: Secondary | ICD-10-CM | POA: Diagnosis present

## 2014-10-05 DIAGNOSIS — R55 Syncope and collapse: Secondary | ICD-10-CM | POA: Diagnosis present

## 2014-10-05 DIAGNOSIS — E039 Hypothyroidism, unspecified: Secondary | ICD-10-CM | POA: Diagnosis present

## 2014-10-05 DIAGNOSIS — J309 Allergic rhinitis, unspecified: Secondary | ICD-10-CM | POA: Diagnosis present

## 2014-10-05 DIAGNOSIS — I7300 Raynaud's syndrome without gangrene: Secondary | ICD-10-CM | POA: Insufficient documentation

## 2014-10-05 DIAGNOSIS — S0990XA Unspecified injury of head, initial encounter: Secondary | ICD-10-CM | POA: Insufficient documentation

## 2014-10-05 DIAGNOSIS — Z3202 Encounter for pregnancy test, result negative: Secondary | ICD-10-CM | POA: Insufficient documentation

## 2014-10-05 DIAGNOSIS — R519 Headache, unspecified: Secondary | ICD-10-CM | POA: Diagnosis present

## 2014-10-05 DIAGNOSIS — R51 Headache: Secondary | ICD-10-CM

## 2014-10-05 DIAGNOSIS — K219 Gastro-esophageal reflux disease without esophagitis: Secondary | ICD-10-CM | POA: Diagnosis present

## 2014-10-05 DIAGNOSIS — Y9389 Activity, other specified: Secondary | ICD-10-CM | POA: Insufficient documentation

## 2014-10-05 DIAGNOSIS — S299XXA Unspecified injury of thorax, initial encounter: Principal | ICD-10-CM | POA: Insufficient documentation

## 2014-10-05 DIAGNOSIS — Y9289 Other specified places as the place of occurrence of the external cause: Secondary | ICD-10-CM | POA: Insufficient documentation

## 2014-10-05 DIAGNOSIS — R0789 Other chest pain: Secondary | ICD-10-CM | POA: Diagnosis present

## 2014-10-05 DIAGNOSIS — W19XXXA Unspecified fall, initial encounter: Secondary | ICD-10-CM | POA: Diagnosis present

## 2014-10-05 DIAGNOSIS — R0989 Other specified symptoms and signs involving the circulatory and respiratory systems: Secondary | ICD-10-CM | POA: Diagnosis present

## 2014-10-05 DIAGNOSIS — S3992XA Unspecified injury of lower back, initial encounter: Secondary | ICD-10-CM | POA: Insufficient documentation

## 2014-10-05 DIAGNOSIS — Z720 Tobacco use: Secondary | ICD-10-CM | POA: Diagnosis present

## 2014-10-05 HISTORY — DX: Gastro-esophageal reflux disease without esophagitis: K21.9

## 2014-10-05 HISTORY — DX: Syncope and collapse: R55

## 2014-10-05 HISTORY — DX: Raynaud's syndrome without gangrene: I73.00

## 2014-10-05 LAB — CBC WITH DIFFERENTIAL/PLATELET
BASOS PCT: 0 % (ref 0–1)
Basophils Absolute: 0 10*3/uL (ref 0.0–0.1)
EOS ABS: 0.3 10*3/uL (ref 0.0–0.7)
Eosinophils Relative: 3 % (ref 0–5)
HCT: 39.4 % (ref 36.0–46.0)
Hemoglobin: 12.8 g/dL (ref 12.0–15.0)
LYMPHS PCT: 23 % (ref 12–46)
Lymphs Abs: 2 10*3/uL (ref 0.7–4.0)
MCH: 30.8 pg (ref 26.0–34.0)
MCHC: 32.5 g/dL (ref 30.0–36.0)
MCV: 94.7 fL (ref 78.0–100.0)
Monocytes Absolute: 0.4 10*3/uL (ref 0.1–1.0)
Monocytes Relative: 5 % (ref 3–12)
NEUTROS PCT: 69 % (ref 43–77)
Neutro Abs: 5.9 10*3/uL (ref 1.7–7.7)
Platelets: 195 10*3/uL (ref 150–400)
RBC: 4.16 MIL/uL (ref 3.87–5.11)
RDW: 12.2 % (ref 11.5–15.5)
WBC: 8.7 10*3/uL (ref 4.0–10.5)

## 2014-10-05 LAB — COMPREHENSIVE METABOLIC PANEL
ALT: 27 U/L (ref 0–35)
AST: 26 U/L (ref 0–37)
Albumin: 4.1 g/dL (ref 3.5–5.2)
Alkaline Phosphatase: 70 U/L (ref 39–117)
Anion gap: 5 (ref 5–15)
BILIRUBIN TOTAL: 0.5 mg/dL (ref 0.3–1.2)
BUN: 7 mg/dL (ref 6–23)
CALCIUM: 9.5 mg/dL (ref 8.4–10.5)
CHLORIDE: 107 mmol/L (ref 96–112)
CO2: 29 mmol/L (ref 19–32)
CREATININE: 0.75 mg/dL (ref 0.50–1.10)
GFR calc Af Amer: >90 mL/min (ref >90)
GFR calc non Af Amer: >90 mL/min (ref >90)
Glucose, Bld: 91 mg/dL (ref 70–99)
Potassium: 3.6 mmol/L (ref 3.5–5.1)
Sodium: 141 mmol/L (ref 135–145)
Total Protein: 6.8 g/dL (ref 6.0–8.3)

## 2014-10-05 LAB — URINALYSIS, ROUTINE W REFLEX MICROSCOPIC
Bilirubin Urine: NEGATIVE
Glucose, UA: NEGATIVE mg/dL
Hgb urine dipstick: NEGATIVE
Ketones, ur: NEGATIVE mg/dL
Nitrite: NEGATIVE
PROTEIN: NEGATIVE mg/dL
Specific Gravity, Urine: 1.01 (ref 1.005–1.030)
Urobilinogen, UA: 0.2 mg/dL (ref 0.0–1.0)
pH: 6 (ref 5.0–8.0)

## 2014-10-05 LAB — PREGNANCY, URINE: PREG TEST UR: NEGATIVE

## 2014-10-05 LAB — URINE MICROSCOPIC-ADD ON

## 2014-10-05 LAB — RAPID URINE DRUG SCREEN, HOSP PERFORMED
Amphetamines: NOT DETECTED
BARBITURATES: NOT DETECTED
Benzodiazepines: NOT DETECTED
Cocaine: NOT DETECTED
OPIATES: NOT DETECTED
Tetrahydrocannabinol: NOT DETECTED

## 2014-10-05 LAB — TROPONIN I: Troponin I: <0.03 ng/mL (ref <0.031)

## 2014-10-05 LAB — TSH: TSH: 8.333 u[IU]/mL — ABNORMAL HIGH (ref 0.350–4.500)

## 2014-10-05 MED ORDER — OXYCODONE-ACETAMINOPHEN 5-325 MG PO TABS
1.0000 | ORAL_TABLET | ORAL | Status: DC | PRN
  Administered 2014-10-06: 1 via ORAL
  Filled 2014-10-05 (×2): qty 1

## 2014-10-05 MED ORDER — SODIUM CHLORIDE 0.9 % IJ SOLN
3.0000 mL | Freq: Two times a day (BID) | INTRAMUSCULAR | Status: DC
  Administered 2014-10-05 – 2014-10-06 (×2): 3 mL via INTRAVENOUS

## 2014-10-05 MED ORDER — ACETAMINOPHEN 650 MG RE SUPP: 650.0000 mg | Freq: Four times a day (QID) | RECTAL | Status: DC | PRN

## 2014-10-05 MED ORDER — SODIUM CHLORIDE 0.9 % IV SOLN
INTRAVENOUS | Status: DC
  Administered 2014-10-05: 14:00:00 via INTRAVENOUS

## 2014-10-05 MED ORDER — SENNOSIDES-DOCUSATE SODIUM 8.6-50 MG PO TABS: 1.0000 | ORAL_TABLET | Freq: Every evening | ORAL | Status: DC | PRN

## 2014-10-05 MED ORDER — PANTOPRAZOLE SODIUM 40 MG PO TBEC
40.0000 mg | DELAYED_RELEASE_TABLET | Freq: Every day | ORAL | Status: DC
  Administered 2014-10-05 – 2014-10-06 (×2): 40 mg via ORAL
  Filled 2014-10-05 (×2): qty 1

## 2014-10-05 MED ORDER — ENOXAPARIN SODIUM 40 MG/0.4ML ~~LOC~~ SOLN
40.0000 mg | SUBCUTANEOUS | Status: DC
  Administered 2014-10-05 – 2014-10-06 (×2): 40 mg via SUBCUTANEOUS
  Filled 2014-10-05 (×2): qty 0.4

## 2014-10-05 MED ORDER — ACETAMINOPHEN 325 MG PO TABS
650.0000 mg | ORAL_TABLET | Freq: Four times a day (QID) | ORAL | Status: DC | PRN
  Administered 2014-10-06: 650 mg via ORAL
  Filled 2014-10-05 (×2): qty 2

## 2014-10-05 MED ORDER — SODIUM CHLORIDE 0.9 % IV BOLUS (SEPSIS)
1000.0000 mL | Freq: Once | INTRAVENOUS | Status: AC
  Administered 2014-10-05: 1000 mL via INTRAVENOUS

## 2014-10-05 MED ORDER — LEVOTHYROXINE SODIUM 88 MCG PO TABS
88.0000 ug | ORAL_TABLET | Freq: Every day | ORAL | Status: DC
  Administered 2014-10-06: 88 ug via ORAL
  Filled 2014-10-05 (×2): qty 1

## 2014-10-05 MED ORDER — KETOROLAC TROMETHAMINE 30 MG/ML IJ SOLN
30.0000 mg | Freq: Once | INTRAMUSCULAR | Status: AC
  Administered 2014-10-05: 30 mg via INTRAVENOUS
  Filled 2014-10-05: qty 1

## 2014-10-05 NOTE — ED Provider Notes (Signed)
CSN: [id number]     Arrival date & time 10/05/14  3086 History   First MD Initiated Contact with Patient 10/05/14 [phone]     Chief Complaint  Patient presents with  . Fall  . Loss of Consciousness     (Consider location/radiation/quality/duration/timing/severity/associated sxs/prior Treatment) HPI Comments: Patient states she passed out at work while standing at the computer. 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 She remembers feeling tired but does not remember feeling dizzy, lightheaded, nauseated or sweaty. Next thing she knows she was on the ground landing on her buttocks and hitting her head. She did lose consciousness. Denies any shortness of breath or chest pain. Over the past several days however she has had intermittent chest pain sometimes right-sided, sometimes left-sided lasting minutes to hours at a time. It is not exertional. She believes it is because she has been out of her Synthroid for the past one week. She complains of pain in her tailbone and in the back of her head after falling today. No focal weakness, numbness or tingling. No dizziness or lightheadedness currently. Good by mouth intake last night. No vomiting or nausea. No previous history of syncope.  Patient is a 54 y.o. female presenting with syncope. The history is provided by the patient.  Loss of Consciousness Associated symptoms: headaches and weakness   Associated symptoms: no chest pain, no dizziness, no fever, no nausea, no shortness of breath and no vomiting     Past Medical History  Diagnosis Date  . Thyroid disease     hypothyroidism  . Raynaud disease   . GERD (gastroesophageal reflux disease)   . Syncope 10/05/2014   Past Surgical History  Procedure Laterality Date  . Cholecystectomy  1982  . Ovarian cyst drainage  1978  . Abdominal hysterectomy  2000  . Tubal ligation  1990  . Appendectomy  1978   Family History  Problem Relation Age of Onset  . Diabetes Maternal  Grandmother   . Diabetes Paternal Grandmother   . Cancer Paternal Uncle   . COPD Father   . COPD Paternal Uncle   . Diabetes Paternal Uncle    History  Substance Use Topics  . Smoking status: Current Every Day Smoker -- 0.50 packs/day for 20 years    Types: Cigarettes  . Smokeless tobacco: Never Used     Comment: started age 36 stopped age 15 then restarted at 36  . Alcohol Use: No     Comment: rarely drinks, reports had a problem with alcoholism a few years ago.   OB History    No data available     Review of Systems  Constitutional: Positive for fatigue. Negative for fever and appetite change.  HENT: Negative for congestion and rhinorrhea.   Eyes: Negative for visual disturbance.  Respiratory: Negative for cough, chest tightness and shortness of breath.   Cardiovascular: Positive for syncope. Negative for chest pain.  Gastrointestinal: Negative for nausea, vomiting and abdominal pain.  Genitourinary: Negative for dysuria, hematuria, vaginal bleeding and vaginal discharge.  Musculoskeletal: Positive for back pain.  Skin: Negative for rash.  Neurological: Positive for weakness and headaches. Negative for dizziness and light-headedness.  A complete 10 system review of systems was obtained and all systems are negative except as noted in the HPI and PMH.      Allergies  Review of patient's allergies indicates no known allergies.  Home Medications   Prior to Admission medications   Medication Sig Start Date  End Date Taking? Authorizing Provider  acetaminophen (TYLENOL) 500 MG tablet Take 1,000 mg by mouth every 6 (six) hours as needed for mild pain.   Yes Historical Provider, MD  ibuprofen (ADVIL,MOTRIN) 200 MG tablet Take 400 mg by mouth every 6 (six) hours as needed for moderate pain.   Yes Historical Provider, MD  levothyroxine (SYNTHROID, LEVOTHROID) 88 MCG tablet Take 88 mcg by mouth daily before breakfast.   Yes Historical Provider, MD  naproxen sodium (ANAPROX) 220 MG  tablet Take 220 mg by mouth 2 (two) times daily as needed (pain).   Yes Historical Provider, MD   BP 113/65 mmHg  Pulse 66  Temp(Src) 97.8 F (36.6 C) (Oral)  Resp 17  Ht  (1.651 m)  Wt 128 lb (58.06 kg)  BMI 21.30 kg/m2  SpO2 99% Physical Exam  Constitutional: She is oriented to person, place, and time. She appears well-developed and well-nourished. No distress.  HENT:  Head: Normocephalic and atraumatic.  Mouth/Throat: Oropharynx is clear and moist. No oropharyngeal exudate.  Eyes: Conjunctivae and EOM are normal. Pupils are equal, round, and reactive to light.  Neck: Normal range of motion. Neck supple.  No C spine pain  Cardiovascular: Normal rate, regular rhythm, normal heart sounds and intact distal pulses.   No murmur heard. Pulmonary/Chest: Effort normal and breath sounds normal. No respiratory distress. She exhibits no tenderness.  Abdominal: Soft. There is no tenderness. There is no rebound and no guarding.  Musculoskeletal: Normal range of motion. She exhibits tenderness. She exhibits no edema.  TTP lumbar spine and sacrum. No step off.  Neurological: She is alert and oriented to person, place, and time. No cranial nerve deficit. She exhibits normal muscle tone. Coordination normal.  No ataxia on finger to nose bilaterally. No pronator drift. 5/5 strength throughout. CN 2-12 intact.Equal grip strength. Sensation intact.  Skin: Skin is warm.  Psychiatric: She has a normal mood and affect. Her behavior is normal.  Nursing note and vitals reviewed.   ED Course  Procedures (including critical care time) Labs Review Labs Reviewed  URINALYSIS, ROUTINE W REFLEX MICROSCOPIC - Abnormal; Notable for the following:    Leukocytes, UA TRACE (*)    All other components within normal limits  CBC WITH DIFFERENTIAL/PLATELET  COMPREHENSIVE METABOLIC PANEL  TROPONIN I  PREGNANCY, URINE  URINE MICROSCOPIC-ADD ON  TROPONIN I  URINE RAPID DRUG SCREEN (HOSP PERFORMED)  HIV  ANTIBODY (ROUTINE TESTING)  TSH  T4, FREE  BASIC METABOLIC PANEL    Imaging Review Dg Chest 2 View  10/05/2014   CLINICAL DATA:  Fall.  Right chest pain  EXAM: CHEST  2 VIEW  COMPARISON:  01/18/2014  FINDINGS: The lungs are hyperinflated compatible with asthma or COPD. Negative for infiltrate or effusion. Heart size and vascularity normal. Negative for mass lesion.  IMPRESSION: Pulmonary hyperinflation.  No acute abnormality.   Electronically Signed   By: Marlan Palau M.D.   On: 10/05/2014 09:23   Dg Lumbar Spine Complete  10/05/2014   CLINICAL DATA:  Fall today.  Back pain  EXAM: LUMBAR SPINE - COMPLETE 4+ VIEW  COMPARISON:  None.  FINDINGS: Mild levoscoliosis at L4. Normal sagittal alignment. No fracture or pars defect. Disc spaces are well maintained without significant disc space narrowing.  IMPRESSION: Negative.   Electronically Signed   By: Marlan Palau M.D.   On: 10/05/2014 09:30   Dg Sacrum/coccyx  10/05/2014   CLINICAL DATA:  Fall.  Pain  EXAM: SACRUM AND COCCYX - 2+  VIEW  COMPARISON:  None.  FINDINGS: There is no evidence of fracture or other focal bone lesions.  IMPRESSION: Negative.   Electronically Signed   By: Marlan Palauharles  Clark M.D.   On: 10/05/2014 09:24   Ct Head Wo Contrast  10/05/2014   CLINICAL DATA:  Fall at work  EXAM: CT HEAD WITHOUT CONTRAST  TECHNIQUE: Contiguous axial images were obtained from the base of the skull through the vertex without intravenous contrast.  COMPARISON:  None.  FINDINGS: No skull fracture is noted. Paranasal sinuses and mastoid air cells are unremarkable. No intracranial hemorrhage, mass effect or midline shift. No acute infarction. No hydrocephalus. No mass lesion is noted on this unenhanced scan. The gray and white-matter differentiation is preserved.  IMPRESSION: No acute intracranial abnormality.   Electronically Signed   By: Natasha MeadLiviu  Pop M.D.   On: 10/05/2014 09:41     EKG Interpretation   Date/Time:  Wednesday October 05 2014 08:48:03  EDT Ventricular Rate:  59 PR Interval:  172 QRS Duration: 88 QT Interval:  448 QTC Calculation: 443 R Axis:   72 Text Interpretation:  Sinus bradycardia Otherwise normal ECG No previous  ECGs available Confirmed by Jaquisha Frech  MD, Lyell Clugston [phone]([id number]) on 10/05/2014  8:53:08 AM      MDM   Final diagnoses:  Fall  Syncope, unspecified syncope type  Chest pain, unspecified chest pain type   Syncope without prodrome.  Fall hitting buttocks and head. Neuro intact.  EKG sinus bradycardia.  Suspect syncope from vasovagal response to prolonged standing.  Orthostatics negative. Endorses poor PO intake.  Imaging negative for acute traumatic injury. Chest pain is atypical for ACS.  Syncope without prodrome is concerning for possible cardiac etiology or arrhythmia. Will admit for observation.    Glynn OctaveStephen Chrstopher Malenfant, MD 10/05/14 [phone]

## 2014-10-05 NOTE — Progress Notes (Signed)
Admission note:  Arrival Method: Patient arrived on a stretcher with staff accompanied from ED. Mental Orientation:  Alert and oriented x 4. Telemetry: 6E-30 NSR, CCMD notified. Assessment: See doc flow sheets. Skin: Warm, dry and intact.  Checked by 2 nurses (Cybill). IV: Right hand, patent and infusing NS. Pain: C/o H/A, given Tylenol. Safety Measures: Bed in low position, bed alarm on, phone and call bell within reach. Fall Prevention Safety Plan: Reviewed the plan with the patient, understood and acknowledged. Admission Screening: In progress. 6700 Orientation: Patient has been oriented to the unit, staff and to the room.

## 2014-10-05 NOTE — ED Notes (Addendum)
Pt c/o fell at work, states was standing by the desk and became weak, and fell--- landing on tailbone, hit head on cupboard. No loc, pt here with drug screen paperwork-- needs UDS-- had to urinate on arrival to ED--  Pt states has not taken thyroid medicine in 1 week-- and has felt heart racing.

## 2014-10-05 NOTE — ED Notes (Signed)
Pt monitored by pulse ox, bp cuff, and 5-lead. 

## 2014-10-05 NOTE — Progress Notes (Signed)
Called the nurse and got the report on this patient.

## 2014-10-05 NOTE — ED Notes (Signed)
Attempted IV stick x 2 (2 different nurses) difficult IV stick-- permission to contact IV team per charge nurse [place], Charity fundraiserN.

## 2014-10-05 NOTE — H&P (Signed)
Date: 10/05/2014               Patient Name:  Monica GerlachCarol Wilkinson MRN: [id number]  DOB: [birth year] Age / Sex: 54 y.o., female   PCP: No Pcp Per Patient         Medical Service: Internal Medicine Teaching Service         Attending Physician: Dr. Aletta EdouardShilpa Bhardwaj, MD    First Contact: Dr. Gypsy LoreSarah Rutstein Pager: [phone]  Second Contact: Dr. Carlynn PurlErik Coty Larsh Pager: [phone]       After Hours (After 5p/  First Contact Pager: [phone]  weekends / holidays): Second Contact Pager: [phone]   Chief Complaint: Syncope  History of Present Illness: Patient is 54 year old woman with PMH significant for hypothyroidism and GERD presents after experiencing syncopal event ~5:30AM after extended period of standing at work. She reports feeling extremely tired for the 30 minutes prior to this episode. She denies feeling dizzy, lightheaded, or acutely nauseated. She remembers falling, denies loss of consciousness, and hit her tailbone and head. She reports feeling nauseated yesterday with decreased appetite, but says she was able to eat a small dinner. She also endorses poor fluid intake. She denies having had previous syncopal episodes.   Prior to this episode, she reports generally feeling unwell for the past 2 weeks. Specifically, she endorses sinus congestion a/w headaches, right-sided toothache, and periodic heart racing. She describes the heart racing as non-exertional, worsened with eating. She also endorses sub-sternal/left-sided chest pain x2 days associated with shortness of breath and some radiation to her neck and right arm. Yesterday, she noticed right-sided chest pain, relieved by lying on her right side. She reports 15 year hx of hypothyroidism and endorses poor compliance with levothyroxine 2/2 financial burden of rx for last 2 weeks, approximately corresponding to beginning of heart-racing symptoms. Reports similar barrier to obtaining PPI for GERD.  Meds: Current Facility-Administered Medications    Medication Dose Route Frequency Provider Last Rate Last Dose  . sodium chloride 0.9 % bolus 1,000 mL  1,000 mL Intravenous Once Glynn OctaveStephen Rancour, MD 500 mL/hr at 10/05/14 1323 1,000 mL at 10/05/14 1323   Current Outpatient Prescriptions  Medication Sig Dispense Refill  . acetaminophen (TYLENOL) 500 MG tablet Take 1,000 mg by mouth every 6 (six) hours as needed for mild pain.    Marland Kitchen. ibuprofen (ADVIL,MOTRIN) 200 MG tablet Take 400 mg by mouth every 6 (six) hours as needed for moderate pain.    Marland Kitchen. levothyroxine (SYNTHROID, LEVOTHROID) 88 MCG tablet Take 88 mcg by mouth daily before breakfast.    . naproxen sodium (ANAPROX) 220 MG tablet Take 220 mg by mouth 2 (two) times daily as needed (pain).      Allergies: Allergies as of 10/05/2014  . (No Known Allergies)   Past Medical History  Diagnosis Date  . Thyroid disease     hypothyroidism  . Raynaud disease    Past Surgical History  Procedure Laterality Date  . Cholecystectomy  1982  . Ovarian cyst drainage  1978  . Abdominal hysterectomy  2000  . Tubal ligation  1990  . Appendectomy  1978   Family History  Problem Relation Age of Onset  . Diabetes Maternal Grandmother   . Diabetes Paternal Grandmother   . Cancer Paternal Uncle   . COPD Father   . COPD Paternal Uncle   . Diabetes Paternal Uncle    History   Social History  . Marital Status: Single    Spouse Name: N/A  . Number  of Children: N/A  . Years of Education: N/A   Occupational History  . Not on file.   Social History Main Topics  . Smoking status: Current Every Day Smoker -- 0.50 packs/day for 20 years    Types: Cigarettes  . Smokeless tobacco: Not on file     Comment: started age 48 stopped age 48 then restarted at 54  . Alcohol Use: No     Comment: rarely drinks, reports had a problem with alcoholism a few years ago.  . Drug Use: No  . Sexual Activity: Not on file   Other Topics Concern  . Not on file   Social History Narrative   Lives with Boyfriend  in [place], works at [employer] at [employer] center.  Orignially from [place].    Review of Systems: Review of Systems  Constitutional: Positive for malaise/fatigue. Negative for fever, chills and weight loss.  HENT: Positive for congestion and sore throat.   Respiratory: Positive for cough and shortness of breath. Negative for hemoptysis.   Cardiovascular: Positive for chest pain and palpitations.  Gastrointestinal: Positive for heartburn and nausea. Negative for vomiting, abdominal pain and diarrhea.  Genitourinary: Positive for frequency. Negative for dysuria and urgency.  Musculoskeletal: Positive for back pain.  Neurological: Positive for weakness and headaches. Negative for dizziness and loss of consciousness.     Physical Exam: BP 117/67 mmHg  Pulse 64  Temp(Src) 97.6 F (36.4 C) (Oral)  Ht  (1.651 m)  Wt 128 lb (58.06 kg)  BMI 21.30 kg/m2  SpO2 100%  General: resting in bed HEENT: PERRL, EOMI, no scleral icterus Cardiac: RRR, no rubs, murmurs or gallops Pulm: clear to auscultation bilaterally, moving normal volumes of air Abd: soft, nontender, nondistended, BS present Back: mild TTP over sacrum, no bruising Ext: warm and well perfused, no pedal edema Neuro: alert and oriented X3, cranial nerves II-XII grossly intact  Lab results: Basic Metabolic Panel:  Recent Labs  16/10/96 0956  NA 141  K 3.6  CL 107  CO2 29  GLUCOSE 91  BUN 7  CREATININE 0.75  CALCIUM 9.5   Liver Function Tests:  Recent Labs  10/05/14 0956  AST 26  ALT 27  ALKPHOS 70  BILITOT 0.5  PROT 6.8  ALBUMIN 4.1   No results for input(s): LIPASE, AMYLASE in the last 72 hours. No results for input(s): AMMONIA in the last 72 hours. CBC:  Recent Labs  10/05/14 0956  WBC 8.7  NEUTROABS 5.9  HGB 12.8  HCT 39.4  MCV 94.7  PLT 195   Cardiac Enzymes:  Recent Labs  10/05/14 0956  TROPONINI <0.03   Urinalysis:  Recent Labs  10/05/14 0925  COLORURINE YELLOW   LABSPEC 1.010  PHURINE 6.0  GLUCOSEU NEGATIVE  HGBUR NEGATIVE  BILIRUBINUR NEGATIVE  KETONESUR NEGATIVE  PROTEINUR NEGATIVE  UROBILINOGEN 0.2  NITRITE NEGATIVE  LEUKOCYTESUR TRACE*    Imaging results:  Dg Chest 2 View  10/05/2014   CLINICAL DATA:  Fall.  Right chest pain  EXAM: CHEST  2 VIEW  COMPARISON:  01/18/2014  FINDINGS: The lungs are hyperinflated compatible with asthma or COPD. Negative for infiltrate or effusion. Heart size and vascularity normal. Negative for mass lesion.  IMPRESSION: Pulmonary hyperinflation.  No acute abnormality.   Electronically Signed   By: Marlan Palau M.D.   On: 10/05/2014 09:23   Dg Lumbar Spine Complete  10/05/2014   CLINICAL DATA:  Fall today.  Back pain  EXAM: LUMBAR SPINE -  COMPLETE 4+ VIEW  COMPARISON:  None.  FINDINGS: Mild levoscoliosis at L4. Normal sagittal alignment. No fracture or pars defect. Disc spaces are well maintained without significant disc space narrowing.  IMPRESSION: Negative.   Electronically Signed   By: Marlan Palau M.D.   On: 10/05/2014 09:30   Dg Sacrum/coccyx  10/05/2014   CLINICAL DATA:  Fall.  Pain  EXAM: SACRUM AND COCCYX - 2+ VIEW  COMPARISON:  None.  FINDINGS: There is no evidence of fracture or other focal bone lesions.  IMPRESSION: Negative.   Electronically Signed   By: Marlan Palau M.D.   On: 10/05/2014 09:24   Ct Head Wo Contrast  10/05/2014   CLINICAL DATA:  Fall at work  EXAM: CT HEAD WITHOUT CONTRAST  TECHNIQUE: Contiguous axial images were obtained from the base of the skull through the vertex without intravenous contrast.  COMPARISON:  None.  FINDINGS: No skull fracture is noted. Paranasal sinuses and mastoid air cells are unremarkable. No intracranial hemorrhage, mass effect or midline shift. No acute infarction. No hydrocephalus. No mass lesion is noted on this unenhanced scan. The gray and white-matter differentiation is preserved.  IMPRESSION: No acute intracranial abnormality.   Electronically Signed    By: Natasha Mead M.D.   On: 10/05/2014 09:41    Other results: EKG: normal EKG, normal sinus rhythm, there are no previous tracings available for comparison.  Assessment & Plan by Problem: Active Problems:   Syncope   Hypothyroidism, adult   Allergic rhinitis   Headache   Tobacco abuse   Atypical chest pain   GERD (gastroesophageal reflux disease)  Patient is a 54 year old woman with PMH significant for poorly-controlled hypothyroidism and GERD presenting with syncopal episode without LOC after prolonged standing at while work resulting in her hitting her sacrum and head.   Syncope: Patient endorses fall but denies LOC. Endorses palpitations x2 weeks + chest pain x2days, possibly indicating cardiac etiology. Normal sinus rhythm on EKG, though can't rule-out transient abnormal rhythm such as atrial fibrillation with RVR. Orthostatic hypotension less likely given her age and normal orthostatic vital signs obtained on admission. Vasovagal response in setting of prolonged standing is most likely. No acute intracranial abnormality on CTA and no evidence of fracture on spine xray.  Plan: IVF NS @ 100cc/hr for 10 hours Admit telemetry holter monitor if recurs Oxycodone for back pain  Atypical chest pain: reports 2-day history of left-sided/substernal chest pain. Non-exertaional. Right-sided chest-pain reproducible with palpation reducing likelihood of ACS etiology. Normal EKG and negative troponin. Will continue telemetry in event 2/2 arrhythmia not detected on EKG.   Plan:  Repeat EKG if chest pain recurs Repeat troponin for ACS r/o  Hypothyroidism: Patient reports 15-year history of hypothyroidism for which she takes levothyroxine. However, reports that she has not been able to refill this medication because she has not been able to get to the pharmacy and obtaining the medication has caused some financial burden. Poor drug adherence may be contributing to general weakness and possibly heart  palpitations, although this is less likely. Plan: Check TSH/free T4 Resume levothyroxine  Headache: Reports sinus headache x1-2 weeks, not relieved by ibuprofen or advil. Likely 2/2 to ongoing sinus congestion, exacerbated by dehydration.  Plan: -acetaminophen prn  GERD: Patient denies current reflux symptoms.  Plan: Pantoprazole   Dispo: Disposition is deferred at this time, awaiting improvement of current medical problems. Anticipated discharge in approximately 1 day(s).   The patient does have a current PCP (No Pcp  Per Patient) and does need an [facility] hospital follow-up appointment after discharge.  The patient does not have transportation limitations that hinder transportation to clinic appointments.  Signed: Gust Rung, DO 10/05/2014, 1:59 PM

## 2014-10-05 NOTE — ED Notes (Signed)
Admitting MDs at bedside.

## 2014-10-06 DIAGNOSIS — R55 Syncope and collapse: Secondary | ICD-10-CM

## 2014-10-06 DIAGNOSIS — Z72821 Inadequate sleep hygiene: Secondary | ICD-10-CM

## 2014-10-06 DIAGNOSIS — F17200 Nicotine dependence, unspecified, uncomplicated: Secondary | ICD-10-CM

## 2014-10-06 DIAGNOSIS — J309 Allergic rhinitis, unspecified: Secondary | ICD-10-CM

## 2014-10-06 DIAGNOSIS — R0789 Other chest pain: Secondary | ICD-10-CM

## 2014-10-06 DIAGNOSIS — K219 Gastro-esophageal reflux disease without esophagitis: Secondary | ICD-10-CM

## 2014-10-06 DIAGNOSIS — E039 Hypothyroidism, unspecified: Secondary | ICD-10-CM

## 2014-10-06 DIAGNOSIS — R0989 Other specified symptoms and signs involving the circulatory and respiratory systems: Secondary | ICD-10-CM | POA: Diagnosis present

## 2014-10-06 DIAGNOSIS — F419 Anxiety disorder, unspecified: Secondary | ICD-10-CM

## 2014-10-06 DIAGNOSIS — R51 Headache: Secondary | ICD-10-CM

## 2014-10-06 DIAGNOSIS — R000 Tachycardia, unspecified: Secondary | ICD-10-CM

## 2014-10-06 LAB — BASIC METABOLIC PANEL
Anion gap: 7 (ref 5–15)
BUN: 9 mg/dL (ref 6–23)
CHLORIDE: 112 mmol/L (ref 96–112)
CO2: 23 mmol/L (ref 19–32)
Calcium: 8.7 mg/dL (ref 8.4–10.5)
Creatinine, Ser: 0.58 mg/dL (ref 0.50–1.10)
GFR calc Af Amer: >90 mL/min (ref >90)
GFR calc non Af Amer: >90 mL/min (ref >90)
GLUCOSE: 107 mg/dL — AB (ref 70–99)
Potassium: 3.8 mmol/L (ref 3.5–5.1)
Sodium: 142 mmol/L (ref 135–145)

## 2014-10-06 LAB — PHOSPHORUS: Phosphorus: 4.2 mg/dL (ref 2.3–4.6)

## 2014-10-06 LAB — LIPID PANEL
CHOL/HDL RATIO: 3.1 ratio
Cholesterol: 134 mg/dL (ref 0–200)
HDL: 43 mg/dL (ref >39)
LDL CALC: 78 mg/dL (ref 0–99)
TRIGLYCERIDES: 67 mg/dL (ref <150)
VLDL: 13 mg/dL (ref 0–40)

## 2014-10-06 LAB — HIV ANTIBODY (ROUTINE TESTING W REFLEX): HIV SCREEN 4TH GENERATION: NONREACTIVE

## 2014-10-06 LAB — MAGNESIUM: Magnesium: 1.8 mg/dL (ref 1.5–2.5)

## 2014-10-06 LAB — MRSA PCR SCREENING: MRSA by PCR: NEGATIVE

## 2014-10-06 LAB — T4, FREE: Free T4: 1.06 ng/dL (ref 0.80–1.80)

## 2014-10-06 MED ORDER — LEVOTHYROXINE SODIUM 88 MCG PO TABS: 88.0000 ug | ORAL_TABLET | Freq: Every day | ORAL | Status: AC

## 2014-10-06 MED ORDER — LORATADINE 10 MG PO TABS
10.0000 mg | ORAL_TABLET | Freq: Every day | ORAL | Status: DC
  Administered 2014-10-06: 10 mg via ORAL
  Filled 2014-10-06: qty 1

## 2014-10-06 MED ORDER — CETIRIZINE HCL 10 MG PO TABS: 10.0000 mg | ORAL_TABLET | Freq: Every day | ORAL | Status: AC

## 2014-10-06 MED ORDER — PANTOPRAZOLE SODIUM 40 MG PO TBEC: 40.0000 mg | DELAYED_RELEASE_TABLET | Freq: Every day | ORAL | Status: DC

## 2014-10-06 NOTE — Discharge Instructions (Signed)
Please continue to take your Synthroid (levothyroxine) 88 mcg daily for hypothyroidism as prescribed.  Please start taking Protonix daily for reflux disease.   Please start taking Zyrtec daily for allergies.   Please make sure to take adequate rest breaks while at work. Avoid continuous standing for extended periods of time, if possible. Sit or lie down if you start to feel lightheaded or dizzy.   Stop taking naproxen. You can take advil or tylenol for pain.   Please attend your follow-up appt tomorrow

## 2014-10-06 NOTE — Progress Notes (Signed)
Subjective:  Pt seen and examined in AM. No acute events overnight. Pt reports she is feeling well with no further episodes of syncope or lightheadedness. She reports feeling stressed and anxious lately. She continues to have feeling of palpitations. She reports protonix helps and would like allergy medication to help with nasal congestion.      Objective: Vital signs in last 24 hours: Filed Vitals:   10/05/14 2348 10/06/14 0219 10/06/14 0644 10/06/14 0836  BP: 94/43 98/44 90/48  117/66  Pulse:  70 69 72  Temp:   98.2 F (36.8 C) 98.1 F (36.7 C)  TempSrc:   Oral Oral  Resp:    16  Height:      Weight:      SpO2:    100%   Weight change:   Intake/Output Summary (Last 24 hours) at 10/06/14 1618 Last data filed at 10/06/14 1558  Gross per 24 hour  Intake    788 ml  Output    700 ml  Net     88 ml   PHYSICAL EXAMINATION:  General: alert, well-developed, and cooperative to examination.  Head: normocephalic and atraumatic.  Lungs: normal respiratory effort, normal breath sounds, no crackles, and no wheezes. Heart: normal rate, regular rhythm Abdomen: soft, non-tender, normal bowel sounds Extremities: No edema   Lab Results: Recent Results (from the past 2160 hour(s))  Urinalysis, Routine w reflex microscopic     Status: Abnormal   Collection Time: 10/05/14  9:25 AM  Result Value Ref Range   Color, Urine YELLOW YELLOW   APPearance CLEAR CLEAR   Specific Gravity, Urine 1.010 1.005 - 1.030   pH 6.0 5.0 - 8.0   Glucose, UA NEGATIVE NEGATIVE mg/dL   Hgb urine dipstick NEGATIVE NEGATIVE   Bilirubin Urine NEGATIVE NEGATIVE   Ketones, ur NEGATIVE NEGATIVE mg/dL   Protein, ur NEGATIVE NEGATIVE mg/dL   Urobilinogen, UA 0.2 0.0 - 1.0 mg/dL   Nitrite NEGATIVE NEGATIVE   Leukocytes, UA TRACE (A) NEGATIVE  Pregnancy, urine     Status: None   Collection Time: 10/05/14  9:25 AM  Result Value Ref Range   Preg Test, Ur NEGATIVE NEGATIVE    Comment:        THE SENSITIVITY OF  THIS METHODOLOGY IS >20 mIU/mL.   Urine microscopic-add on     Status: None   Collection Time: 10/05/14  9:25 AM  Result Value Ref Range   Squamous Epithelial / LPF RARE RARE   WBC, UA 0-2 <3 WBC/hpf   Bacteria, UA RARE RARE  CBC with Differential/Platelet     Status: None   Collection Time: 10/05/14  9:56 AM  Result Value Ref Range   WBC 8.7 4.0 - 10.5 K/uL   RBC 4.16 3.87 - 5.11 MIL/uL   Hemoglobin 12.8 12.0 - 15.0 g/dL   HCT 39.4 36.0 - 46.0 %   MCV 94.7 78.0 - 100.0 fL   MCH 30.8 26.0 - 34.0 pg   MCHC 32.5 30.0 - 36.0 g/dL   RDW 12.2 11.5 - 15.5 %   Platelets 195 150 - 400 K/uL   Neutrophils Relative % 69 43 - 77 %   Neutro Abs 5.9 1.7 - 7.7 K/uL   Lymphocytes Relative 23 12 - 46 %   Lymphs Abs 2.0 0.7 - 4.0 K/uL   Monocytes Relative 5 3 - 12 %   Monocytes Absolute 0.4 0.1 - 1.0 K/uL   Eosinophils Relative 3 0 - 5 %   Eosinophils Absolute 0.3  0.0 - 0.7 K/uL   Basophils Relative 0 0 - 1 %   Basophils Absolute 0.0 0.0 - 0.1 K/uL  Comprehensive metabolic panel     Status: None   Collection Time: 10/05/14  9:56 AM  Result Value Ref Range   Sodium 141 135 - 145 mmol/L   Potassium 3.6 3.5 - 5.1 mmol/L   Chloride 107 96 - 112 mmol/L   CO2 29 19 - 32 mmol/L   Glucose, Bld 91 70 - 99 mg/dL   BUN 7 6 - 23 mg/dL   Creatinine, Ser 0.75 0.50 - 1.10 mg/dL   Calcium 9.5 8.4 - 10.5 mg/dL   Total Protein 6.8 6.0 - 8.3 g/dL   Albumin 4.1 3.5 - 5.2 g/dL   AST 26 0 - 37 U/L   ALT 27 0 - 35 U/L   Alkaline Phosphatase 70 39 - 117 U/L   Total Bilirubin 0.5 0.3 - 1.2 mg/dL   GFR calc non Af Amer >90 >90 mL/min   GFR calc Af Amer >90 >90 mL/min    Comment: (NOTE) The eGFR has been calculated using the CKD EPI equation. This calculation has not been validated in all clinical situations. eGFR's persistently <90 mL/min signify possible Chronic Kidney Disease.    Anion gap 5 5 - 15    Comment: REPEATED TO VERIFY  Troponin I     Status: None   Collection Time: 10/05/14  9:56 AM    Result Value Ref Range   Troponin I <0.03 <0.031 ng/mL    Comment:        NO INDICATION OF MYOCARDIAL INJURY.   Urine rapid drug screen (hosp performed)     Status: None   Collection Time: 10/05/14  1:33 PM  Result Value Ref Range   Opiates NONE DETECTED NONE DETECTED   Cocaine NONE DETECTED NONE DETECTED   Benzodiazepines NONE DETECTED NONE DETECTED   Amphetamines NONE DETECTED NONE DETECTED   Tetrahydrocannabinol NONE DETECTED NONE DETECTED   Barbiturates NONE DETECTED NONE DETECTED    Comment:        DRUG SCREEN FOR MEDICAL PURPOSES ONLY.  IF CONFIRMATION IS NEEDED FOR ANY PURPOSE, NOTIFY LAB WITHIN 5 DAYS.        LOWEST DETECTABLE LIMITS FOR URINE DRUG SCREEN Drug Class       Cutoff (ng/mL) Amphetamine      1000 Barbiturate      200 Benzodiazepine   267 Tricyclics       124 Opiates          300 Cocaine          300 THC              50   Troponin I     Status: None   Collection Time: 10/05/14  4:48 PM  Result Value Ref Range   Troponin I <0.03 <0.031 ng/mL    Comment:        NO INDICATION OF MYOCARDIAL INJURY.   HIV antibody     Status: None   Collection Time: 10/05/14  4:48 PM  Result Value Ref Range   HIV Screen 4th Generation wRfx Non Reactive Non Reactive    Comment: (NOTE) Performed At: [facility] [place], [place] [id number] Lindon Romp MD SN:[id number]   TSH     Status: Abnormal   Collection Time: 10/05/14  4:48 PM  Result Value Ref Range   TSH 8.333 (H) 0.350 - 4.500 uIU/mL  T4, free  Status: None   Collection Time: 10/05/14  4:48 PM  Result Value Ref Range   Free T4 1.06 0.80 - 1.80 ng/dL    Comment: Performed at [facility]  MRSA PCR Screening     Status: None   Collection Time: 10/06/14  3:28 AM  Result Value Ref Range   MRSA by PCR NEGATIVE NEGATIVE    Comment:        The GeneXpert MRSA Assay (FDA approved for NASAL specimens only), is one component of a comprehensive MRSA  colonization surveillance program. It is not intended to diagnose MRSA infection nor to guide or monitor treatment for MRSA infections.   Basic metabolic panel     Status: Abnormal   Collection Time: 10/06/14  5:00 AM  Result Value Ref Range   Sodium 142 135 - 145 mmol/L   Potassium 3.8 3.5 - 5.1 mmol/L   Chloride 112 96 - 112 mmol/L   CO2 23 19 - 32 mmol/L   Glucose, Bld 107 (H) 70 - 99 mg/dL   BUN 9 6 - 23 mg/dL   Creatinine, Ser 0.58 0.50 - 1.10 mg/dL   Calcium 8.7 8.4 - 10.5 mg/dL   GFR calc non Af Amer >90 >90 mL/min   GFR calc Af Amer >90 >90 mL/min    Comment: (NOTE) The eGFR has been calculated using the CKD EPI equation. This calculation has not been validated in all clinical situations. eGFR's persistently <90 mL/min signify possible Chronic Kidney Disease.    Anion gap 7 5 - 15  Magnesium     Status: None   Collection Time: 10/06/14  5:00 AM  Result Value Ref Range   Magnesium 1.8 1.5 - 2.5 mg/dL  Phosphorus     Status: None   Collection Time: 10/06/14  5:00 AM  Result Value Ref Range   Phosphorus 4.2 2.3 - 4.6 mg/dL  Lipid panel     Status: None   Collection Time: 10/06/14  8:00 AM  Result Value Ref Range   Cholesterol 134 0 - 200 mg/dL   Triglycerides 67 <150 mg/dL   HDL 43 >39 mg/dL   Total CHOL/HDL Ratio 3.1 RATIO   VLDL 13 0 - 40 mg/dL   LDL Cholesterol 78 0 - 99 mg/dL    Comment:        Total Cholesterol/HDL:CHD Risk Coronary Heart Disease Risk Table                     Men   Women  1/2 Average Risk   3.4   3.3  Average Risk       5.0   4.4  2 X Average Risk   9.6   7.1  3 X Average Risk  23.4   11.0        Use the calculated Patient Ratio above and the CHD Risk Table to determine the patient's CHD Risk.        ATP III CLASSIFICATION (LDL):  <100     mg/dL   Optimal  100-129  mg/dL   Near or Above                    Optimal  130-159  mg/dL   Borderline  160-189  mg/dL   High  >190     mg/dL   Very High      Micro Results: Recent  Results (from the past 240 hour(s))  MRSA PCR Screening     Status: None  Collection Time: 10/06/14  3:28 AM  Result Value Ref Range Status   MRSA by PCR NEGATIVE NEGATIVE Final    Comment:        The GeneXpert MRSA Assay (FDA approved for NASAL specimens only), is one component of a comprehensive MRSA colonization surveillance program. It is not intended to diagnose MRSA infection nor to guide or monitor treatment for MRSA infections.    Studies/Results: Dg Chest 2 View  10/05/2014   CLINICAL DATA:  Fall.  Right chest pain  EXAM: CHEST  2 VIEW  COMPARISON:  01/18/2014  FINDINGS: The lungs are hyperinflated compatible with asthma or COPD. Negative for infiltrate or effusion. Heart size and vascularity normal. Negative for mass lesion.  IMPRESSION: Pulmonary hyperinflation.  No acute abnormality.   Electronically Signed   By: Franchot Gallo M.D.   On: 10/05/2014 09:23   Dg Lumbar Spine Complete  10/05/2014   CLINICAL DATA:  Fall today.  Back pain  EXAM: LUMBAR SPINE - COMPLETE 4+ VIEW  COMPARISON:  None.  FINDINGS: Mild levoscoliosis at L4. Normal sagittal alignment. No fracture or pars defect. Disc spaces are well maintained without significant disc space narrowing.  IMPRESSION: Negative.   Electronically Signed   By: Franchot Gallo M.D.   On: 10/05/2014 09:30   Dg Sacrum/coccyx  10/05/2014   CLINICAL DATA:  Fall.  Pain  EXAM: SACRUM AND COCCYX - 2+ VIEW  COMPARISON:  None.  FINDINGS: There is no evidence of fracture or other focal bone lesions.  IMPRESSION: Negative.   Electronically Signed   By: Franchot Gallo M.D.   On: 10/05/2014 09:24   Ct Head Wo Contrast  10/05/2014   CLINICAL DATA:  Fall at work  EXAM: CT HEAD WITHOUT CONTRAST  TECHNIQUE: Contiguous axial images were obtained from the base of the skull through the vertex without intravenous contrast.  COMPARISON:  None.  FINDINGS: No skull fracture is noted. Paranasal sinuses and mastoid air cells are unremarkable. No  intracranial hemorrhage, mass effect or midline shift. No acute infarction. No hydrocephalus. No mass lesion is noted on this unenhanced scan. The gray and white-matter differentiation is preserved.  IMPRESSION: No acute intracranial abnormality.   Electronically Signed   By: Lahoma Crocker M.D.   On: 10/05/2014 09:41   Echo  10/06/2014: Study conclusions - Left ventricle: The cavity size was normal. Systolic function was normal. The estimated ejection fraction was in the range of 55% to 60%. Wall motion was normal; there were no regional wall motion abnormalities. Left ventricular diastolic function parameters were normal.  Medications: I have reviewed the patient's current medications. Scheduled Meds: . enoxaparin (LOVENOX) injection  40 mg Subcutaneous Q24H  . levothyroxine  88 mcg Oral QAC breakfast  . loratadine  10 mg Oral Daily  . pantoprazole  40 mg Oral Daily  . sodium chloride  3 mL Intravenous Q12H   Continuous Infusions:  PRN Meds:.acetaminophen **OR** acetaminophen, oxyCODONE-acetaminophen, senna-docusate Assessment/Plan: Active Problems:   Syncope   Hypothyroidism, adult   Allergic rhinitis   Headache   Tobacco abuse   Atypical chest pain   GERD (gastroesophageal reflux disease)   Pain in the chest   Fall   Faintness   Hyperinflation of lungs  Patient is a 54 year old woman with PMH significant for poorly-controlled hypothyroidism and GERD presenting with syncopal episode without LOC after prolonged standing at while work resulting in her hitting her sacrum and head.   Syncope: Patient endorses fall but denies LOC. Endorses palpitations  x2 weeks + chest pain x2days, possibly indicating cardiac etiology. Normal sinus rhythm on EKG, though can't rule-out transient abnormal rhythm such as atrial fibrillation with RVR. Orthostatic hypotension less likely given her age and normal orthostatic vital signs obtained on admission. Vasovagal response in setting of prolonged  standing is most likely. No acute intracranial abnormality on CTA and no evidence of fracture on spine xray. Telemetry did not reveal any arrhythmia; periodic bradycardia to mid 50s consistent with poorly controlled hypothyroidism.Patient received 1L IVF and oxycodone for back pain. Plan: - d/c telemetry - holter monitor if recurs - acetaminophen vs Oxycodone for back pain - encourage PO fluid intake  Atypical chest pain: reports 2-day history of left-sided/substernal chest pain. Non-exertaional. Right-sided chest-pain reproducible with palpation reducing likelihood of ACS etiology. Normal EKG and negative troponin. No abnormalities detected on telemetry except for periodic bradycardia. Pain may be 2/2 anxiety in setting of ongoing financial stressors and recent relocation to [place].  Plan:  - Echo  Hypothyroidism: Patient reports 15-year history of hypothyroidism for which she takes levothyroxine. However, reports that she has not been able to refill this medication because she has not been able to get to the pharmacy and obtaining the medication has caused some financial burden. Poor drug adherence may be contributing to general weakness and possibly heart palpitations, although this is less likely. TSH was high (8.5) but T4 was within normal limits (1.04) Plan: - Resume levothyroxine 57mg  Headache: Reports sinus headache x1-2 weeks, not relieved by ibuprofen or advil. Likely 2/2 to ongoing sinus congestion, exacerbated by dehydration.  Plan: -acetaminophen prn - start claritin   GERD: Patient denies current reflux symptoms, previously sx relieved by PPI Plan: -Continue Pantoprazole   Allergic Rhinitis  -Start claritin   Dispo: Disposition is deferred at this time, awaiting improvement of current medical problems.  Anticipated discharge in approximately 1 day(s).   The patient does not have a current PCP (No Pcp Per Patient) and [facility].  The patient does not have transportation limitations that hinder transportation to clinic appointments.  .Services Needed at time of discharge: Y = Yes, Blank = No PT:   OT:   RN:   Equipment:   Other:       SCarolan Shiver Med Student 10/06/2014, 4:18 PM

## 2014-10-06 NOTE — Progress Notes (Signed)
Pt discharge instructions given, pt verbalized understanding, pt waiting on transportation home.

## 2014-10-06 NOTE — Progress Notes (Signed)
  Echocardiogram 2D Echocardiogram has been performed.  Cathie BeamsGREGORY, Alegra Rost 10/06/2014, 2:14 PM

## 2014-10-06 NOTE — Progress Notes (Signed)
UR completed 

## 2014-10-06 NOTE — Discharge Summary (Signed)
Name: Monica Wilkinson MRN: [id number] DOB: [birth year] 54 y.o. PCP: No Pcp Per Patient  Date of Admission: 10/05/2014  8:05 AM Date of Discharge: 10/06/2014 Attending Physician: Aletta Edouard, MD  Discharge Diagnosis:  Syncope Atypical chest pain Hypothyroidism Headache GERD Allergic Rhinitis  HIV screening    Discharge Medications:   Medication List    STOP taking these medications        naproxen sodium 220 MG tablet  Commonly known as:  ANAPROX      TAKE these medications        acetaminophen 500 MG tablet  Commonly known as:  TYLENOL  Take 1,000 mg by mouth every 6 (six) hours as needed for mild pain.     cetirizine 10 MG tablet  Commonly known as:  ZYRTEC  Take 1 tablet (10 mg total) by mouth daily.     ibuprofen 200 MG tablet  Commonly known as:  ADVIL,MOTRIN  Take 400 mg by mouth every 6 (six) hours as needed for moderate pain.     levothyroxine 88 MCG tablet  Commonly known as:  SYNTHROID, LEVOTHROID  Take 1 tablet (88 mcg total) by mouth daily before breakfast.     pantoprazole 40 MG tablet  Commonly known as:  PROTONIX  Take 1 tablet (40 mg total) by mouth daily.        Disposition and follow-up:   Ms.Monica Wilkinson was discharged from [facility] in Good condition.  At the hospital follow up visit please address:  1.  Recurrent syncope/presyncope - needs frequent work breaks (may need work letter specifying this)       Poor sleep - educate pt on sleep hygiene            Palpitations in setting of possible anxiety - consider starting anxiolytic/SSRI therapy             Hypothyroidism - if compliant with synthroid              GERD - pt restarted on protonix             Allergic Rhinitis - pt started on zyrtec daily          2.  Labs / imaging needed at time of follow-up: Consider PFTs to assess for COPD as pt is smoker with hyperinflated lungs on chest xray  3.  Pending labs/ test needing follow-up: A1C  Follow-up  Appointments: Follow-up Information    Follow up with [facility].   Why:  Follow up appointment , Friday, October 07, 2014 @ 1:45pm.    Contact information:   [address] [place] [address] [phone]      Discharge Instructions:  Please continue to take your Synthroid (levothyroxine) for hypothyroidism as prescribed.  Please start taking Protonix for reflux disease. This should be available over the counter.  Please start taking Zyrtec daily for allergies.   Please make sure to take adequate rest breaks while at work. Avoid continuous standing for extended periods of time, if possible. Sit or lie down if you start to feel lightheaded or dizzy.   Stop taking naproxen. You can take advil or tylenol for pain.   Please attend your follow-up appt tomorrow   Procedures Performed:  Dg Chest 2 View  10/05/2014   CLINICAL DATA:  Fall.  Right chest pain  EXAM: CHEST  2 VIEW  COMPARISON:  01/18/2014  FINDINGS: The lungs are hyperinflated compatible with asthma or COPD.  Negative for infiltrate or effusion. Heart size and vascularity normal. Negative for mass lesion.  IMPRESSION: Pulmonary hyperinflation.  No acute abnormality.   Electronically Signed   By: Marlan Palauharles  Clark M.D.   On: 10/05/2014 09:23   Dg Lumbar Spine Complete  10/05/2014   CLINICAL DATA:  Fall today.  Back pain  EXAM: LUMBAR SPINE - COMPLETE 4+ VIEW  COMPARISON:  None.  FINDINGS: Mild levoscoliosis at L4. Normal sagittal alignment. No fracture or pars defect. Disc spaces are well maintained without significant disc space narrowing.  IMPRESSION: Negative.   Electronically Signed   By: Marlan Palauharles  Clark M.D.   On: 10/05/2014 09:30   Dg Sacrum/coccyx  10/05/2014   CLINICAL DATA:  Fall.  Pain  EXAM: SACRUM AND COCCYX - 2+ VIEW  COMPARISON:  None.  FINDINGS: There is no evidence of fracture or other focal bone lesions.  IMPRESSION: Negative.   Electronically Signed   By: Marlan Palauharles   Clark M.D.   On: 10/05/2014 09:24   Ct Head Wo Contrast  10/05/2014   CLINICAL DATA:  Fall at work  EXAM: CT HEAD WITHOUT CONTRAST  TECHNIQUE: Contiguous axial images were obtained from the base of the skull through the vertex without intravenous contrast.  COMPARISON:  None.  FINDINGS: No skull fracture is noted. Paranasal sinuses and mastoid air cells are unremarkable. No intracranial hemorrhage, mass effect or midline shift. No acute infarction. No hydrocephalus. No mass lesion is noted on this unenhanced scan. The gray and white-matter differentiation is preserved.  IMPRESSION: No acute intracranial abnormality.   Electronically Signed   By: Natasha MeadLiviu  Pop M.D.   On: 10/05/2014 09:41    2D Echo: 10/06/2014 - Left ventricle: The cavity size was normal. Systolic function was normal. The estimated ejection fraction was in the range of 55% to 60%. Wall motion was normal; there were no regional wall motion abnormalities. Left ventricular diastolic function parameters were normal.   Admission HPI: Patient is 54 year old woman with PMH significant for hypothyroidism and GERD presents after experiencing syncopal event ~5:30AM after extended period of standing at work. She reports feeling extremely tired for the 30 minutes prior to this episode. She denies feeling dizzy, lightheaded, or acutely nauseated. She remembers falling, denies loss of consciousness, and hit her tailbone and head. She reports feeling nauseated yesterday with decreased appetite, but says she was able to eat a small dinner. She also endorses poor fluid intake. She denies having had previous syncopal episodes.   Prior to this episode, she reports generally feeling unwell for the past 2 weeks. Specifically, she endorses sinus congestion a/w headaches, right-sided toothache, and periodic heart racing. She describes the heart racing as non-exertional, worsened with eating. She also endorses sub-sternal/left-sided chest pain x2 days  associated with shortness of breath and some radiation to her neck and right arm. Yesterday, she noticed right-sided chest pain, relieved by lying on her right side. She reports 15 year hx of hypothyroidism and endorses poor compliance with levothyroxine 2/2 financial burden of rx for last 2 weeks, approximately corresponding to beginning of heart-racing symptoms. Reports similar barrier to obtaining PPI for GERD.  Hospital Course by problem list:   Syncope: Patient presented with unwitnessed episode of syncope after extended period of standing at work suggesting vasovagal response. She also reported palpitations for 2 weeks and chest pain for 2 days, possibly indicating cardiac etiology. She had no EKG changes and no events on telemetry. Orthostatic vital signs were negative. UDS was negative. There was no  acute intracranial abnormality on CT head and no evidence of fracture on spine xray. 2D-echo revealed normal EF with no valvular dysfunction. She has chronic hypothyroidism not on replacement for unclear duration of time due to poor medical follow-up. Her synthroid was restarted during hospitalization and was instructed to continue on discharge. Patient was advised to avoid excessive continuous standing at work. Patient also counseled on importance of adequate PO fluid intake and sleep hygiene (as she works night shift). She had no further episodes of syncope and ambulated without lightheadedness on day of discharge. Pt to have close follow-up with [facility]. If she has recurrent syncopal episode or persistent palpitations would consider holter monitor and cardiology referral.   Atypical chest pain: Patient reported 2-day history of left-sided/substernal chest pain that was non-exertional with some radiation to neck and right arm. Pt had normal EKG on admission and negative cardiac enzyme. Patient had no events on telemetry (periodic episodes of bradycardia likely secondary to poorly  controlled hypothyroidism). 2D-echo was normal. Despite complaints of palpitations and "heart racing," HR remained normal or low-level normal throughout hospitalization. The symptoms may be secondary to uncontrolled GERD vs uncontrolled hypothyroidism vs anxiety (in the setting of patient's described financial stressors and recent relocation to [place]).   Hypothyroidism: Patient reported 15-year history of hypothyroidism for which she takes levothyroxine. However, reported that she has not been able to refill this medication because she been unable to get to the pharmacy and obtaining the medication has caused some financial burden. Poor drug adherence may be contributing to pt's general weakness and possibly heart palpitations. TSH was high (8.5) but T4 was within normal limits (1.04) indicating sub-clinical hypothyroidism however pt symptomatic and treated with replacement therapy levothyroxine daily, which she was instructed to continue on discharge.    Headache: Unlikely to be related to recent fall given negative head CT. Patient reports sinus headache x1-2 weeks, not relieved by ibuprofen or advil. Most likely due to chronic sinus congestion in setting of allergic rhinitis. Patient received acetaminophen and claritin during hospitalization and instructed to take tylenol or advil (stop naproxen) in addition to zyrtec at discharge.  GERD: Patient denied current reflux symptoms however with atypical chest pain. She was previously on PPI but ran out of the medication. She was prescribed pantoprazole 40 mg daily on discharge.   Allergic Rhinitis: Pt received claritin during hospitalization and instructed to take zyrtec 10 mg daily on discharge.   HIV screening - Pt tested negative for HIV.  Discharge Vitals:   BP 117/66 mmHg  Pulse 72  Temp(Src) 98.1 F (36.7 C) (Oral)  Resp 16  Ht  (1.651 m)  Wt 61.9 kg (136 lb 7.4 oz)  BMI 22.71 kg/m2  SpO2 100%  Discharge Labs:  Results for  orders placed or performed during the hospital encounter of 10/05/14 (from the past 24 hour(s))  Troponin I     Status: None   Collection Time: 10/05/14  4:48 PM  Result Value Ref Range   Troponin I <0.03 <0.031 ng/mL  HIV antibody     Status: None   Collection Time: 10/05/14  4:48 PM  Result Value Ref Range   HIV Screen 4th Generation wRfx Non Reactive Non Reactive  TSH     Status: Abnormal   Collection Time: 10/05/14  4:48 PM  Result Value Ref Range   TSH 8.333 (H) 0.350 - 4.500 uIU/mL  T4, free     Status: None   Collection Time: 10/05/14  4:48 PM  Result Value Ref Range   Free T4 1.06 0.80 - 1.80 ng/dL  MRSA PCR Screening     Status: None   Collection Time: 10/06/14  3:28 AM  Result Value Ref Range   MRSA by PCR NEGATIVE NEGATIVE  Basic metabolic panel     Status: Abnormal   Collection Time: 10/06/14  5:00 AM  Result Value Ref Range   Sodium 142 135 - 145 mmol/L   Potassium 3.8 3.5 - 5.1 mmol/L   Chloride 112 96 - 112 mmol/L   CO2 23 19 - 32 mmol/L   Glucose, Bld 107 (H) 70 - 99 mg/dL   BUN 9 6 - 23 mg/dL   Creatinine, Ser 1.61 0.50 - 1.10 mg/dL   Calcium 8.7 8.4 - 09.6 mg/dL   GFR calc non Af Amer >90 >90 mL/min   GFR calc Af Amer >90 >90 mL/min   Anion gap 7 5 - 15  Magnesium     Status: None   Collection Time: 10/06/14  5:00 AM  Result Value Ref Range   Magnesium 1.8 1.5 - 2.5 mg/dL  Phosphorus     Status: None   Collection Time: 10/06/14  5:00 AM  Result Value Ref Range   Phosphorus 4.2 2.3 - 4.6 mg/dL  Lipid panel     Status: None   Collection Time: 10/06/14  8:00 AM  Result Value Ref Range   Cholesterol 134 0 - 200 mg/dL   Triglycerides 67 <045 mg/dL   HDL 43 >40 mg/dL   Total CHOL/HDL Ratio 3.1 RATIO   VLDL 13 0 - 40 mg/dL   LDL Cholesterol 78 0 - 99 mg/dL    Signed:  Otis Brace, MD PGY-II IMTS  [facility]    Services Ordered on Discharge: None Equipment Ordered on Discharge: None

## 2014-10-07 ENCOUNTER — Inpatient Hospital Stay: Payer: Self-pay | Admitting: Family Medicine

## 2014-10-07 LAB — HEMOGLOBIN A1C
Hgb A1c MFr Bld: 5.5 % (ref 4.8–5.6)
Mean Plasma Glucose: 111 mg/dL

## 2014-10-10 ENCOUNTER — Inpatient Hospital Stay: Payer: Self-pay | Admitting: Family Medicine

## 2014-10-11 ENCOUNTER — Inpatient Hospital Stay: Payer: Self-pay | Admitting: Family Medicine

## 2014-10-17 ENCOUNTER — Encounter: Payer: Self-pay | Admitting: Family Medicine

## 2014-10-17 ENCOUNTER — Ambulatory Visit: Payer: Self-pay | Attending: Family Medicine | Admitting: Family Medicine

## 2014-10-17 VITALS — BP 126/80 | HR 74 | Temp 97.8°F | Resp 16 | Ht 65.0 in | Wt 136.0 lb

## 2014-10-17 DIAGNOSIS — J328 Other chronic sinusitis: Secondary | ICD-10-CM

## 2014-10-17 DIAGNOSIS — R55 Syncope and collapse: Secondary | ICD-10-CM | POA: Insufficient documentation

## 2014-10-17 DIAGNOSIS — Z720 Tobacco use: Secondary | ICD-10-CM | POA: Insufficient documentation

## 2014-10-17 DIAGNOSIS — J329 Chronic sinusitis, unspecified: Secondary | ICD-10-CM | POA: Insufficient documentation

## 2014-10-17 DIAGNOSIS — E039 Hypothyroidism, unspecified: Secondary | ICD-10-CM | POA: Insufficient documentation

## 2014-10-17 MED ORDER — PANTOPRAZOLE SODIUM 40 MG PO TBEC: 40.0000 mg | DELAYED_RELEASE_TABLET | Freq: Every day | ORAL | Status: AC

## 2014-10-17 MED ORDER — AMOXICILLIN 500 MG PO CAPS: 500.0000 mg | ORAL_CAPSULE | Freq: Three times a day (TID) | ORAL | Status: AC

## 2014-10-17 NOTE — Progress Notes (Signed)
Patient passed out at work. Patient here for hospital follow up, admitted 3/31-4/1, for to monitor heart. Patient prescribed protonix for acid reflux, has not started, can't afford. Acid reflux is good when patient tales medication. Patient reports no pain, feeling fatigued, nauseous. Has chest congestion that she can't get out.

## 2014-10-17 NOTE — Progress Notes (Signed)
Subjective:    Patient ID: Monica Wilkinson, female    DOB: [birth year], 54 y.o.   MRN: [id number]  HPI  Monica Wilkinson was admitted at [facility] between 10/05/14 and 10/06/14 after she had presented to the ED for an episode of syncope at work with no loss of consciousness. She had negative troponins, EKG and telemetry was normal and she was negative for orthostasis. She did have an echocardiogram which revealed normal ejection fraction of 55-60%. Of note she does have a history of hypothyroidism but had been off her medications on TSH level came back elevated at 8.333. She was placed on Zyrtec and discharged after all workup had been unrevealing.   Interval History: She denies any syncopal episodes after discharge but does admit to being dizzy and has had a frontal headache and feels tired as well. He got at work last night she had to go lie down to rest. She does not have any fever but does admit to some nasal congestion and having to clear her throat to produce some mucus.  Review of Systems  Constitutional: Positive for fatigue. Negative for activity change and appetite change.  HENT: Positive for congestion. Negative for sinus pressure and sore throat.   Eyes: Negative for visual disturbance.  Respiratory: Negative for cough, chest tightness, shortness of breath and wheezing.   Cardiovascular: Negative for chest pain and palpitations.  Gastrointestinal: Negative for abdominal pain, constipation and abdominal distention.  Endocrine: Negative for polydipsia.  Genitourinary: Negative for dysuria and frequency.  Musculoskeletal: Negative for back pain and arthralgias.  Skin: Negative for rash.  Neurological: Positive for light-headedness. Negative for tremors and numbness.  Hematological: Does not bruise/bleed easily.  Psychiatric/Behavioral: Negative for behavioral problems and agitation.         Objective:  Filed Vitals:   10/17/14 1031  BP: 126/80  Pulse: 74  Temp: 97.8 F (36.6  C)  Resp: 16      Physical Exam  Constitutional: She is oriented to person, place, and time. She appears well-developed and well-nourished. No distress.  HENT:  Head: Normocephalic.  Right Ear: External ear normal.  Left Ear: External ear normal.  Nose: Nose normal.  Mouth/Throat: Oropharynx is clear and moist.  Left maxillary sinus tenderness  Eyes: Conjunctivae and EOM are normal. Pupils are equal, round, and reactive to light.  Neck: Normal range of motion. No JVD present.  Cardiovascular: Normal rate, regular rhythm, normal heart sounds and intact distal pulses.  Exam reveals no gallop.   No murmur heard. Pulmonary/Chest: Effort normal and breath sounds normal. No respiratory distress. She has no wheezes. She has no rales. She exhibits no tenderness.  Abdominal: Soft. Bowel sounds are normal. She exhibits no distension and no mass. There is no tenderness.  Musculoskeletal: Normal range of motion. She exhibits no edema or tenderness.  Neurological: She is alert and oriented to person, place, and time. She has normal reflexes.  Skin: Skin is warm and dry. She is not diaphoretic.  Psychiatric: She has a normal mood and affect.     Lab Results  Component Value Date   TSH 8.333* 10/05/2014         Assessment & Plan:  54 year old female patient recently hospitalized for syncope with workup negative so far, uncontrolled hypothyroidism recently commenced on thyroid medications who is here to establish care.  Syncope: Workup so far unrevealing. No repeat episodes of syncope however she does complain of some lightheadedness. We will consider Holter monitor if repeat episode occurs  but to have advised her to increase fluid intake and at this time vasovagal syncope cannot be excluded as she stands at her job for long hours and works third shift.  Sinusitis: The lightheadedness and headache she is experiencing could be from the sinusitis which I am treating with antibiotics and  have advised her to also take an analgesics, rest, increase fluid intake.  Hypothyroidism: Uncontrolled the patient has resumed her thyroid medication. She will need a recheck of her thyroid levels in a few weeks.  Tobacco abuse: Smoking cessation support: smoking cessation hotline: 1-800-QUIT-NOW.  Smoking cessation classes are available through [facility]. Call [phone] or visit our website at [email or web address].  Spent 3 counseling on smoking cessations and patient is working on quitting..Marland Kitchen

## 2014-10-17 NOTE — Patient Instructions (Signed)
Smoking Cessation Quitting smoking is important to your health and has many advantages. However, it is not always easy to quit since nicotine is a very addictive drug. Oftentimes, people try 3 times or more before being able to quit. This document explains the best ways for you to prepare to quit smoking. Quitting takes hard work and a lot of effort, but you can do it. ADVANTAGES OF QUITTING SMOKING  You will live longer, feel better, and live better.  Your body will feel the impact of quitting smoking almost immediately.  Within 20 minutes, blood pressure decreases. Your pulse returns to its normal level.  After 8 hours, carbon monoxide levels in the blood return to normal. Your oxygen level increases.  After 24 hours, the chance of having a heart attack starts to decrease. Your breath, hair, and body stop smelling like smoke.  After 48 hours, damaged nerve endings begin to recover. Your sense of taste and smell improve.  After 72 hours, the body is virtually free of nicotine. Your bronchial tubes relax and breathing becomes easier.  After 2 to 12 weeks, lungs can hold more air. Exercise becomes easier and circulation improves.  The risk of having a heart attack, stroke, cancer, or lung disease is greatly reduced.  After 1 year, the risk of coronary heart disease is cut in half.  After 5 years, the risk of stroke falls to the same as a nonsmoker.  After 10 years, the risk of lung cancer is cut in half and the risk of other cancers decreases significantly.  After 15 years, the risk of coronary heart disease drops, usually to the level of a nonsmoker.  If you are pregnant, quitting smoking will improve your chances of having a healthy baby.  The people you live with, especially any children, will be healthier.  You will have extra money to spend on things other than cigarettes. QUESTIONS TO THINK ABOUT BEFORE ATTEMPTING TO QUIT You may want to talk about your answers with your  health care provider.  Why do you want to quit?  If you tried to quit in the past, what helped and what did not?  What will be the most difficult situations for you after you quit? How will you plan to handle them?  Who can help you through the tough times? Your family? Friends? A health care provider?  What pleasures do you get from smoking? What ways can you still get pleasure if you quit? Here are some questions to ask your health care provider:  How can you help me to be successful at quitting?  What medicine do you think would be best for me and how should I take it?  What should I do if I need more help?  What is smoking withdrawal like? How can I get information on withdrawal? GET READY  Set a quit date.  Change your environment by getting rid of all cigarettes, ashtrays, matches, and lighters in your home, car, or work. Do not let people smoke in your home.  Review your past attempts to quit. Think about what worked and what did not. GET SUPPORT AND ENCOURAGEMENT You have a better chance of being successful if you have help. You can get support in many ways.  Tell your family, friends, and coworkers that you are going to quit and need their support. Ask them not to smoke around you.  Get individual, group, or telephone counseling and support. Programs are available at local hospitals and health centers. Call   your local health department for information about programs in your area.  Spiritual beliefs and practices may help some smokers quit.  Download a "quit meter" on your computer to keep track of quit statistics, such as how long you have gone without smoking, cigarettes not smoked, and money saved.  Get a self-help book about quitting smoking and staying off tobacco. LEARN NEW SKILLS AND BEHAVIORS  Distract yourself from urges to smoke. Talk to someone, go for a walk, or occupy your time with a task.  Change your normal routine. Take a different route to work.  Drink tea instead of coffee. Eat breakfast in a different place.  Reduce your stress. Take a hot bath, exercise, or read a book.  Plan something enjoyable to do every day. Reward yourself for not smoking.  Explore interactive web-based programs that specialize in helping you quit. GET MEDICINE AND USE IT CORRECTLY Medicines can help you stop smoking and decrease the urge to smoke. Combining medicine with the above behavioral methods and support can greatly increase your chances of successfully quitting smoking.  Nicotine replacement therapy helps deliver nicotine to your body without the negative effects and risks of smoking. Nicotine replacement therapy includes nicotine gum, lozenges, inhalers, nasal sprays, and skin patches. Some may be available over-the-counter and others require a prescription.  Antidepressant medicine helps people abstain from smoking, but how this works is unknown. This medicine is available by prescription.  Nicotinic receptor partial agonist medicine simulates the effect of nicotine in your brain. This medicine is available by prescription. Ask your health care provider for advice about which medicines to use and how to use them based on your health history. Your health care provider will tell you what side effects to look out for if you choose to be on a medicine or therapy. Carefully read the information on the package. Do not use any other product containing nicotine while using a nicotine replacement product.  RELAPSE OR DIFFICULT SITUATIONS Most relapses occur within the first 3 months after quitting. Do not be discouraged if you start smoking again. Remember, most people try several times before finally quitting. You may have symptoms of withdrawal because your body is used to nicotine. You may crave cigarettes, be irritable, feel very hungry, cough often, get headaches, or have difficulty concentrating. The withdrawal symptoms are only temporary. They are strongest  when you first quit, but they will go away within 10-14 days. To reduce the chances of relapse, try to:  Avoid drinking alcohol. Drinking lowers your chances of successfully quitting.  Reduce the amount of caffeine you consume. Once you quit smoking, the amount of caffeine in your body increases and can give you symptoms, such as a rapid heartbeat, sweating, and anxiety.  Avoid smokers because they can make you want to smoke.  Do not let weight gain distract you. Many smokers will gain weight when they quit, usually less than 10 pounds. Eat a healthy diet and stay active. You can always lose the weight gained after you quit.  Find ways to improve your mood other than smoking. FOR MORE INFORMATION  www.smokefree.gov  Document Released: 06/18/2001 Document Revised: 11/08/2013 Document Reviewed: 10/03/2011 Exit[employer] Patient Information 2015 [employer]. This information is not intended to replace advice given to you by your health care provider. Make sure you discuss any questions you have with your health care provider.  

## 2014-10-20 ENCOUNTER — Encounter (HOSPITAL_COMMUNITY): Payer: Self-pay

## 2014-10-20 ENCOUNTER — Emergency Department (HOSPITAL_COMMUNITY)
Admission: EM | Admit: 2014-10-20 | Discharge: 2014-10-21 | Disposition: A | Discharge status: Home / Self Care | Payer: Self-pay | Attending: Emergency Medicine | Admitting: Emergency Medicine

## 2014-10-20 DIAGNOSIS — J019 Acute sinusitis, unspecified: Secondary | ICD-10-CM | POA: Insufficient documentation

## 2014-10-20 DIAGNOSIS — R519 Headache, unspecified: Secondary | ICD-10-CM

## 2014-10-20 DIAGNOSIS — Z8679 Personal history of other diseases of the circulatory system: Secondary | ICD-10-CM | POA: Insufficient documentation

## 2014-10-20 DIAGNOSIS — Z720 Tobacco use: Secondary | ICD-10-CM | POA: Insufficient documentation

## 2014-10-20 DIAGNOSIS — N739 Female pelvic inflammatory disease, unspecified: Secondary | ICD-10-CM | POA: Insufficient documentation

## 2014-10-20 DIAGNOSIS — K219 Gastro-esophageal reflux disease without esophagitis: Secondary | ICD-10-CM | POA: Insufficient documentation

## 2014-10-20 DIAGNOSIS — Z79899 Other long term (current) drug therapy: Secondary | ICD-10-CM | POA: Insufficient documentation

## 2014-10-20 DIAGNOSIS — N730 Acute parametritis and pelvic cellulitis: Secondary | ICD-10-CM

## 2014-10-20 DIAGNOSIS — Z792 Long term (current) use of antibiotics: Secondary | ICD-10-CM | POA: Insufficient documentation

## 2014-10-20 DIAGNOSIS — R51 Headache: Secondary | ICD-10-CM

## 2014-10-20 DIAGNOSIS — E039 Hypothyroidism, unspecified: Secondary | ICD-10-CM | POA: Insufficient documentation

## 2014-10-20 NOTE — ED Notes (Addendum)
Pt reports headache x1 week that she has been unable to get rid of. Pt also reports vaginal discharge and reports she may have yeast infection. Pt c/o lower back pain.

## 2014-10-20 NOTE — ED Notes (Signed)
Pt also complains of a headache and low back pain, she states that she has slept for two days and has no energy to do anything

## 2014-10-20 NOTE — ED Notes (Signed)
Pt states that she feels weak with a fever, no appetite for one week, was treated for a sinus infection this week

## 2014-10-21 LAB — GC/CHLAMYDIA PROBE AMP (~~LOC~~) NOT AT ARMC
Chlamydia: NEGATIVE
NEISSERIA GONORRHEA: NEGATIVE

## 2014-10-21 LAB — URINALYSIS, ROUTINE W REFLEX MICROSCOPIC
BILIRUBIN URINE: NEGATIVE
Glucose, UA: NEGATIVE mg/dL
KETONES UR: NEGATIVE mg/dL
NITRITE: NEGATIVE
PH: 6 (ref 5.0–8.0)
PROTEIN: NEGATIVE mg/dL
Specific Gravity, Urine: 1.028 (ref 1.005–1.030)
Urobilinogen, UA: 1 mg/dL (ref 0.0–1.0)

## 2014-10-21 LAB — COMPREHENSIVE METABOLIC PANEL
ALT: 44 U/L — ABNORMAL HIGH (ref 0–35)
AST: 28 U/L (ref 0–37)
Albumin: 3.8 g/dL (ref 3.5–5.2)
Alkaline Phosphatase: 68 U/L (ref 39–117)
Anion gap: 6 (ref 5–15)
BUN: 11 mg/dL (ref 6–23)
CALCIUM: 8.7 mg/dL (ref 8.4–10.5)
CO2: 26 mmol/L (ref 19–32)
CREATININE: 0.61 mg/dL (ref 0.50–1.10)
Chloride: 105 mmol/L (ref 96–112)
GFR calc non Af Amer: >90 mL/min (ref >90)
GLUCOSE: 101 mg/dL — AB (ref 70–99)
Potassium: 3.6 mmol/L (ref 3.5–5.1)
Sodium: 137 mmol/L (ref 135–145)
TOTAL PROTEIN: 6.6 g/dL (ref 6.0–8.3)
Total Bilirubin: 0.6 mg/dL (ref 0.3–1.2)

## 2014-10-21 LAB — CBC WITH DIFFERENTIAL/PLATELET
Basophils Absolute: 0 10*3/uL (ref 0.0–0.1)
Basophils Relative: 1 % (ref 0–1)
Eosinophils Absolute: 0.4 10*3/uL (ref 0.0–0.7)
Eosinophils Relative: 6 % — ABNORMAL HIGH (ref 0–5)
HCT: 37.8 % (ref 36.0–46.0)
HEMOGLOBIN: 12.4 g/dL (ref 12.0–15.0)
LYMPHS ABS: 1.2 10*3/uL (ref 0.7–4.0)
LYMPHS PCT: 16 % (ref 12–46)
MCH: 31.2 pg (ref 26.0–34.0)
MCHC: 32.8 g/dL (ref 30.0–36.0)
MCV: 95.2 fL (ref 78.0–100.0)
MONO ABS: 0.7 10*3/uL (ref 0.1–1.0)
MONOS PCT: 9 % (ref 3–12)
NEUTROS ABS: 5 10*3/uL (ref 1.7–7.7)
NEUTROS PCT: 68 % (ref 43–77)
Platelets: 200 10*3/uL (ref 150–400)
RBC: 3.97 MIL/uL (ref 3.87–5.11)
RDW: 12.2 % (ref 11.5–15.5)
WBC: 7.3 10*3/uL (ref 4.0–10.5)

## 2014-10-21 LAB — WET PREP, GENITAL
CLUE CELLS WET PREP: NONE SEEN
TRICH WET PREP: NONE SEEN
Yeast Wet Prep HPF POC: NONE SEEN

## 2014-10-21 LAB — URINE MICROSCOPIC-ADD ON

## 2014-10-21 LAB — RPR: RPR Ser Ql: NONREACTIVE

## 2014-10-21 LAB — HIV ANTIBODY (ROUTINE TESTING W REFLEX): HIV Screen 4th Generation wRfx: NONREACTIVE

## 2014-10-21 MED ORDER — LIDOCAINE HCL 1 % IJ SOLN
INTRAMUSCULAR | Status: AC
  Administered 2014-10-21: 20 mL
  Filled 2014-10-21: qty 20

## 2014-10-21 MED ORDER — CEFTRIAXONE SODIUM 250 MG IJ SOLR
250.0000 mg | Freq: Once | INTRAMUSCULAR | Status: AC
  Administered 2014-10-21: 250 mg via INTRAMUSCULAR
  Filled 2014-10-21: qty 250

## 2014-10-21 MED ORDER — ONDANSETRON 8 MG PO TBDP: 8.0000 mg | ORAL_TABLET | Freq: Three times a day (TID) | ORAL | Status: AC | PRN

## 2014-10-21 MED ORDER — TRAMADOL HCL 50 MG PO TABS
50.0000 mg | ORAL_TABLET | Freq: Once | ORAL | Status: AC
  Administered 2014-10-21: 50 mg via ORAL
  Filled 2014-10-21: qty 1

## 2014-10-21 MED ORDER — TRAMADOL HCL 50 MG PO TABS: 50.0000 mg | ORAL_TABLET | Freq: Four times a day (QID) | ORAL | Status: AC | PRN

## 2014-10-21 MED ORDER — DOXYCYCLINE HYCLATE 100 MG PO CAPS: 100.0000 mg | ORAL_CAPSULE | Freq: Two times a day (BID) | ORAL | Status: AC

## 2014-10-21 MED ORDER — AZITHROMYCIN 250 MG PO TABS
1000.0000 mg | ORAL_TABLET | Freq: Once | ORAL | Status: AC
  Administered 2014-10-21: 1000 mg via ORAL
  Filled 2014-10-21: qty 4

## 2014-10-21 NOTE — ED Provider Notes (Signed)
CSN: [id number]     Arrival date & time 10/20/14  2308 History   First MD Initiated Contact with Patient 10/20/14 2344     Chief Complaint  Patient presents with  . Headache  . Vaginal Discharge     (Consider location/radiation/quality/duration/timing/severity/associated sxs/prior Treatment) HPI 54 year old female presents to emergency department from home with complaint of headache, backache, pain and vaginal discharge.  She also reports low-grade fever, fatigue.  Patient recently admitted for syncopal episode.  During syncope.  She fell striking the back of her head and lower back.  She reports she is still having some tailbone pain.  She reports since being discharged home.  She has had persistent headaches.  She was seen earlier this week by primary care, [facility] and started on amoxicillin for sinusitis.  Patient reports back pain and vaginal discharge began today.  She reports temp of 100.5 yesterday.  She reports that she has lost her appetite and has been unable to drink as much she would normally Past Medical History  Diagnosis Date  . Thyroid disease     hypothyroidism  . Raynaud disease   . GERD (gastroesophageal reflux disease)   . Syncope 10/05/2014   Past Surgical History  Procedure Laterality Date  . Cholecystectomy  1982  . Ovarian cyst drainage  1978  . Abdominal hysterectomy  2000  . Tubal ligation  1990  . Appendectomy  1978   Family History  Problem Relation Age of Onset  . Diabetes Maternal Grandmother   . Diabetes Paternal Grandmother   . Cancer Paternal Uncle   . COPD Father   . COPD Paternal Uncle   . Diabetes Paternal Uncle    History  Substance Use Topics  . Smoking status: Current Every Day Smoker -- 0.50 packs/day for 20 years    Types: Cigarettes  . Smokeless tobacco: Never Used     Comment: started age 54 stopped age 54 then restarted at [age]  . 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   OB History    No data available     Review of Systems   See History of Present Illness; otherwise all other systems are reviewed and negative  Allergies  Review of patient's allergies indicates no known allergies.  Home Medications   Prior to Admission medications   Medication Sig Start Date End Date Taking? Authorizing Provider  acetaminophen (TYLENOL) 500 MG tablet Take 1,000 mg by mouth every 6 (six) hours as needed for mild pain.   Yes Historical Provider, MD  amoxicillin (AMOXIL) 500 MG capsule Take 1 capsule (500 mg total) by mouth 3 (three) times daily. 10/17/14  Yes Jaclyn ShaggyEnobong Amao, MD  cetirizine (ZYRTEC) 10 MG tablet Take 1 tablet (10 mg total) by mouth daily. 10/06/14  Yes Otis BraceMarjan Rabbani, MD  levothyroxine (SYNTHROID, LEVOTHROID) 88 MCG tablet Take 1 tablet (88 mcg total) by mouth daily before breakfast. 10/06/14  Yes Marjan Rabbani, MD  naproxen sodium (ANAPROX) 220 MG tablet Take 440 mg by mouth 2 (two) times daily as needed (pain).   Yes Historical Provider, MD  pantoprazole (PROTONIX) 40 MG tablet Take 1 tablet (40 mg total) by mouth daily. 10/17/14  Yes Jaclyn ShaggyEnobong Amao, MD   BP 119/65 mmHg  Pulse 82  Temp(Src) 98.2 F (36.8 C) (Oral)  Resp 18  Ht 5\' 5"  (1.651 m)  Wt 136 lb (61.689 kg)  BMI 22.63 kg/m2  SpO2  97% Physical Exam  Constitutional: She is oriented to person, place, and time. She appears well-developed and well-nourished.  HENT:  Head: Normocephalic and atraumatic.  Right Ear: External ear normal.  Left Ear: External ear normal.  Nose: Nose normal.  Mouth/Throat: Oropharynx is clear and moist.  Eyes: Conjunctivae and EOM are normal. Pupils are equal, round, and reactive to light.  Neck: Normal range of motion. Neck supple. No JVD present. No tracheal deviation present. No thyromegaly present.  Cardiovascular: Normal rate, regular rhythm, normal heart sounds and intact distal pulses.  Exam reveals no gallop and no friction rub.   No murmur  heard. Pulmonary/Chest: Effort normal and breath sounds normal. No stridor. No respiratory distress. She has no wheezes. She has no rales. She exhibits no tenderness.  Abdominal: Soft. Bowel sounds are normal. She exhibits no distension and no mass. There is no tenderness. There is no rebound and no guarding.  Genitourinary:  External genitalia within normal limits Vagina with thick green, yellow discharge Cervix  normal positive for cervical motion tenderness Adnexa palpated, no masses or positive for tenderness noted Bladder palpated negative for tenderness Uterus palpated no masses or positive for tenderness    Musculoskeletal: Normal range of motion. She exhibits no edema or tenderness.  Lymphadenopathy:    She has no cervical adenopathy.  Neurological: She is alert and oriented to person, place, and time. She displays normal reflexes. She exhibits normal muscle tone. Coordination normal.  Skin: Skin is warm and dry. No rash noted. No erythema. No pallor.  Psychiatric: She has a normal mood and affect. Her behavior is normal. Judgment and thought content normal.  Nursing note and vitals reviewed.   ED Course  Procedures (including critical care time) Labs Review Labs Reviewed  WET PREP, GENITAL - Abnormal; Notable for the following:    WBC, Wet Prep HPF POC TOO NUMEROUS TO COUNT (*)    All other components within normal limits  URINALYSIS, ROUTINE W REFLEX MICROSCOPIC - Abnormal; Notable for the following:    APPearance CLOUDY (*)    Hgb urine dipstick TRACE (*)    Leukocytes, UA MODERATE (*)    All other components within normal limits  COMPREHENSIVE METABOLIC PANEL - Abnormal; Notable for the following:    Glucose, Bld 101 (*)    ALT 44 (*)    All other components within normal limits  CBC WITH DIFFERENTIAL/PLATELET - Abnormal; Notable for the following:    Eosinophils Relative 6 (*)    All other components within normal limits  URINE CULTURE  URINE MICROSCOPIC-ADD ON   RPR  HIV ANTIBODY (ROUTINE TESTING)  GC/CHLAMYDIA PROBE AMP ([place])    Imaging Review No results found.   EKG Interpretation None      MDM   Final diagnoses:  Persistent headaches  PID (acute pelvic inflammatory disease)  Acute sinusitis, recurrence not specified, unspecified location   54 year old female with complaints of headache, recently taking antibiotics for sinusitis.  Also with recent head injury, generalized fatigue and reported low-grade fevers, low back pain and vaginal discharge.  The discharge is concerning for possible STI.  Patient received Rocephin and Zithromax here.  Will start on doxycycline, patient already on amoxicillin which should help with any urinary tract infections.  Patient advised she may be having some post concussive symptoms, and instructed to follow-up with her primary care doctor next week if symptoms persist.    Marisa Severin, MD 10/21/14 [phone]

## 2014-10-21 NOTE — Discharge Instructions (Signed)
Your workup in the emergency department today showed signs of dehydration, which may be contributed to your headache and fatigue.  Take the Zofran as needed for any nausea, UR having.  Increase your fluid intake.  Your pelvic exam today showed a discharge that is concerning for a pelvic infection.  Take antibiotics in addition to the current antibiotics that you are taking.  No sexual contact until you're results have returned.  Follow-up with your gynecologist for recheck.  Your symptoms may be due to your recent fall and head injury.  This is called postconcussive syndrome.  If symptoms persist after you finish the antibiotics for your sinusitis, follow-up with her primary care Dr. for further workup.  Return to the emergency room for worsening condition or new concerning symptoms.    General Headache Without Cause A headache is pain or discomfort felt around the head or neck area. The specific cause of a headache may not be found. There are many causes and types of headaches. A few common ones are:  Tension headaches.  Migraine headaches.  Cluster headaches.  Chronic daily headaches. HOME CARE INSTRUCTIONS   Keep all follow-up appointments with your caregiver or any specialist referral.  Only take over-the-counter or prescription medicines for pain or discomfort as directed by your caregiver.  Lie down in a dark, quiet room when you have a headache.  Keep a headache journal to find out what may trigger your migraine headaches. For example, write down:  What you eat and drink.  How much sleep you get.  Any change to your diet or medicines.  Try massage or other relaxation techniques.  Put ice packs or heat on the head and neck. Use these 3 to 4 times per day for 15 to 20 minutes each time, or as needed.  Limit stress.  Sit up straight, and do not tense your muscles.  Quit smoking if you smoke.  Limit alcohol use.  Decrease the amount of caffeine you drink, or stop drinking  caffeine.  Eat and sleep on a regular schedule.  Get 7 to 9 hours of sleep, or as recommended by your caregiver.  Keep lights dim if bright lights bother you and make your headaches worse. SEEK MEDICAL CARE IF:   You have problems with the medicines you were prescribed.  Your medicines are not working.  You have a change from the usual headache.  You have nausea or vomiting. SEEK IMMEDIATE MEDICAL CARE IF:   Your headache becomes severe.  You have a fever.  You have a stiff neck.  You have loss of vision.  You have muscular weakness or loss of muscle control.  You start losing your balance or have trouble walking.  You feel faint or pass out.  You have severe symptoms that are different from your first symptoms. MAKE SURE YOU:   Understand these instructions.  Will watch your condition.  Will get help right away if you are not doing well or get worse. Document Released: 06/24/2005 Document Revised: 09/16/2011 Document Reviewed: 07/10/2011 [facility] Patient Information 2015 [place], [place]. This information is not intended to replace advice given to you by your health care provider. Make sure you discuss any questions you have with your health care provider.  Pelvic Inflammatory Disease Pelvic inflammatory disease (PID) refers to an infection in some or all of the female organs. The infection can be in the uterus, ovaries, fallopian tubes, or the surrounding tissues in the pelvis. PID can cause abdominal or pelvic pain that  comes on suddenly (acute pelvic pain). PID is a serious infection because it can lead to lasting (chronic) pelvic pain or the inability to have children (infertile).  CAUSES  The infection is often caused by the normal bacteria found in the vaginal tissues. PID may also be caused by an infection that is spread during sexual contact. PID can also occur following:   The birth of a baby.   A miscarriage.   An abortion.   Major pelvic surgery.    The use of an intrauterine device (IUD).   A sexual assault.  RISK FACTORS Certain factors can put a person at higher risk for PID, such as:  Being younger than 25 years.  Being sexually active at [place] age.  Usingnonbarrier contraception.  Havingmultiple sexual partners.  Having sex with someone who has symptoms of a genital infection.  Using oral contraception. Other times, certain behaviors can increase the possibility of getting PID, such as:  Having sex during your period.  Using a vaginal douche.  Having an intrauterine device (IUD) in place. SYMPTOMS   Abdominal or pelvic pain.   Fever.   Chills.   Abnormal vaginal discharge.  Abnormal uterine bleeding.   Unusual pain shortly after finishing your period. DIAGNOSIS  Your caregiver will choose some of the following methods to make a diagnosis, such as:   Performinga physical exam and history. A pelvic exam typically reveals a very tender uterus and surrounding pelvis.   Ordering laboratory tests including a pregnancy test, blood tests, and urine test.  Orderingcultures of the vagina and cervix to check for a sexually transmitted infection (STI).  Performing an ultrasound.   Performing a laparoscopic procedure to look inside the pelvis.  TREATMENT   Antibiotic medicines may be prescribed and taken by mouth.   Sexual partners may be treated when the infection is caused by a sexually transmitted disease (STD).   Hospitalization may be needed to give antibiotics intravenously.  Surgery may be needed, but this is rare. It may take weeks until you are completely well. If you are diagnosed with PID, you should also be checked for human immunodeficiency virus (HIV). HOME CARE INSTRUCTIONS   If given, take your antibiotics as directed. Finish the medicine even if you start to feel better.   Only take over-the-counter or prescription medicines for pain, discomfort, or fever as  directed by your caregiver.   Do not have sexual intercourse until treatment is completed or as directed by your caregiver. If PID is confirmed, your recent sexual partner(s) will need treatment.   Keep your follow-up appointments. SEEK MEDICAL CARE IF:   You have increased or abnormal vaginal discharge.   You need prescription medicine for your pain.   You vomit.   You cannot take your medicines.   Your partner has an STD.  SEEK IMMEDIATE MEDICAL CARE IF:   You have a fever.   You have increased abdominal or pelvic pain.   You have chills.   You have pain when you urinate.   You are not better after 72 hours following treatment.  MAKE SURE YOU:   Understand these instructions.  Will watch your condition.  Will get help right away if you are not doing well or get worse. Document Released: 06/24/2005 Document Revised: 10/19/2012 Document Reviewed: 06/20/2011 [facility] Patient Information 2015 [place], [place]. This information is not intended to replace advice given to you by your health care provider. Make sure you discuss any questions you have with your health care  provider.  Sinusitis Sinusitis is redness, soreness, and inflammation of the paranasal sinuses. Paranasal sinuses are air pockets within the bones of your face (beneath the eyes, the middle of the forehead, or above the eyes). In healthy paranasal sinuses, mucus is able to drain out, and air is able to circulate through them by way of your nose. However, when your paranasal sinuses are inflamed, mucus and air can become trapped. This can allow bacteria and other germs to grow and cause infection. Sinusitis can develop quickly and last only a short time (acute) or continue over a long period (chronic). Sinusitis that lasts for more than 12 weeks is considered chronic.  CAUSES  Causes of sinusitis include:  Allergies.  Structural abnormalities, such as displacement of the cartilage that separates your  nostrils (deviated septum), which can decrease the air flow through your nose and sinuses and affect sinus drainage.  Functional abnormalities, such as when the small hairs (cilia) that line your sinuses and help remove mucus do not work properly or are not present. SIGNS AND SYMPTOMS  Symptoms of acute and chronic sinusitis are the same. The primary symptoms are pain and pressure around the affected sinuses. Other symptoms include:  Upper toothache.  Earache.  Headache.  Bad breath.  Decreased sense of smell and taste.  A cough, which worsens when you are lying flat.  Fatigue.  Fever.  Thick drainage from your nose, which often is green and may contain pus (purulent).  Swelling and warmth over the affected sinuses. DIAGNOSIS  Your health care provider will perform a physical exam. During the exam, your health care provider may:  Look in your nose for signs of abnormal growths in your nostrils (nasal polyps).  Tap over the affected sinus to check for signs of infection.  View the inside of your sinuses (endoscopy) using an imaging device that has a light attached (endoscope). If your health care provider suspects that you have chronic sinusitis, one or more of the following tests may be recommended:  Allergy tests.  Nasal culture. A sample of mucus is taken from your nose, sent to a lab, and screened for bacteria.  Nasal cytology. A sample of mucus is taken from your nose and examined by your health care provider to determine if your sinusitis is related to an allergy. TREATMENT  Most cases of acute sinusitis are related to a viral infection and will resolve on their own within 10 days. Sometimes medicines are prescribed to help relieve symptoms (pain medicine, decongestants, nasal steroid sprays, or saline sprays).  However, for sinusitis related to a bacterial infection, your health care provider will prescribe antibiotic medicines. These are medicines that will help kill  the bacteria causing the infection.  Rarely, sinusitis is caused by a fungal infection. In theses cases, your health care provider will prescribe antifungal medicine. For some cases of chronic sinusitis, surgery is needed. Generally, these are cases in which sinusitis recurs more than 3 times per year, despite other treatments. HOME CARE INSTRUCTIONS   Drink plenty of water. Water helps thin the mucus so your sinuses can drain more easily.  Use a humidifier.  Inhale steam 3 to 4 times a day (for example, sit in the bathroom with the shower running).  Apply a warm, moist washcloth to your face 3 to 4 times a day, or as directed by your health care provider.  Use saline nasal sprays to help moisten and clean your sinuses.  Take medicines only as directed by your health  care provider.  If you were prescribed either an antibiotic or antifungal medicine, finish it all even if you start to feel better. SEEK IMMEDIATE MEDICAL CARE IF:  You have increasing pain or severe headaches.  You have nausea, vomiting, or drowsiness.  You have swelling around your face.  You have vision problems.  You have a stiff neck.  You have difficulty breathing. MAKE SURE YOU:   Understand these instructions.  Will watch your condition.  Will get help right away if you are not doing well or get worse. Document Released: 06/24/2005 Document Revised: 11/08/2013 Document Reviewed: 07/09/2011 [facility] Patient Information 2015 [place], [place]. This information is not intended to replace advice given to you by your health care provider. Make sure you discuss any questions you have with your health care provider.

## 2014-10-22 LAB — URINE CULTURE
Colony Count: NO GROWTH
Culture: NO GROWTH

## 2014-11-04 ENCOUNTER — Telehealth: Payer: Self-pay | Admitting: Emergency Medicine

## 2014-11-16 ENCOUNTER — Ambulatory Visit: Payer: Self-pay | Admitting: Internal Medicine

## 2015-05-23 ENCOUNTER — Inpatient Hospital Stay
Admit: 2015-05-23 | Discharge: 2015-05-24 | Disposition: A | Discharge status: Home / Self Care | Payer: Self-pay | Attending: Emergency Medicine

## 2015-05-23 DIAGNOSIS — F3289 Other specified depressive episodes: Secondary | ICD-10-CM

## 2015-05-23 LAB — URINALYSIS W/ RFLX MICROSCOPIC
Bilirubin: NEGATIVE
Blood: NEGATIVE
Glucose: NEGATIVE mg/dL
Ketone: 15 mg/dL — AB
Leukocyte Esterase: NEGATIVE
Nitrites: NEGATIVE
Protein: NEGATIVE mg/dL
Specific gravity: 1.017 (ref 1.005–1.030)
Urobilinogen: 1 EU/dL (ref 0.2–1.0)
pH (UA): 7.5 (ref 5.0–8.0)

## 2015-05-23 LAB — METABOLIC PANEL, BASIC
Anion gap: 8 mmol/L (ref 3.0–18)
BUN/Creatinine ratio: 10 — ABNORMAL LOW (ref 12–20)
BUN: 7 MG/DL (ref 7.0–18)
CO2: 30 mmol/L (ref 21–32)
Calcium: 9.2 MG/DL (ref 8.5–10.1)
Chloride: 106 mmol/L (ref 100–108)
Creatinine: 0.7 MG/DL (ref 0.6–1.3)
GFR est AA: >60 mL/min/{1.73_m2} (ref >60)
GFR est non-AA: >60 mL/min/{1.73_m2} (ref >60)
Glucose: 91 mg/dL (ref 74–99)
Potassium: 3.4 mmol/L — ABNORMAL LOW (ref 3.5–5.5)
Sodium: 144 mmol/L (ref 136–145)

## 2015-05-23 LAB — CBC WITH AUTOMATED DIFF
ABS. BASOPHILS: 0 10*3/uL (ref 0.0–0.06)
ABS. EOSINOPHILS: 0.3 10*3/uL (ref 0.0–0.4)
ABS. LYMPHOCYTES: 2.1 10*3/uL (ref 0.9–3.6)
ABS. MONOCYTES: 0.4 10*3/uL (ref 0.05–1.2)
ABS. NEUTROPHILS: 3.8 10*3/uL (ref 1.8–8.0)
BASOPHILS: 0 % (ref 0–2)
EOSINOPHILS: 5 % (ref 0–5)
HCT: 39.1 % (ref 35.0–45.0)
HGB: 13 g/dL (ref 12.0–16.0)
LYMPHOCYTES: 32 % (ref 21–52)
MCH: 30.8 PG (ref 24.0–34.0)
MCHC: 33.2 g/dL (ref 31.0–37.0)
MCV: 92.7 FL (ref 74.0–97.0)
MONOCYTES: 5 % (ref 3–10)
MPV: 9.3 FL (ref 9.2–11.8)
NEUTROPHILS: 58 % (ref 40–73)
PLATELET: 215 10*3/uL (ref 135–420)
RBC: 4.22 M/uL (ref 4.20–5.30)
RDW: 12.1 % (ref 11.6–14.5)
WBC: 6.7 10*3/uL (ref 4.6–13.2)

## 2015-05-23 LAB — CARDIAC PANEL,(CK, CKMB & TROPONIN)
CK - MB: <0.5 ng/ml — ABNORMAL LOW (ref 0.5–3.6)
CK: 48 U/L (ref 26–192)
Troponin-I, QT: <0.02 NG/ML (ref 0.00–0.06)

## 2015-05-23 LAB — DRUG SCREEN, URINE
AMPHETAMINES: NEGATIVE
BARBITURATES: NEGATIVE
BENZODIAZEPINES: NEGATIVE
COCAINE: NEGATIVE
METHADONE: NEGATIVE
OPIATES: NEGATIVE
PCP(PHENCYCLIDINE): NEGATIVE
THC (TH-CANNABINOL): NEGATIVE

## 2015-05-23 LAB — ETHYL ALCOHOL: ALCOHOL(ETHYL),SERUM: <3 MG/DL (ref 0–3)

## 2015-05-23 MED ORDER — ACETAMINOPHEN 325 MG TABLET
325 mg | ORAL | Status: AC
Start: 2015-05-23 — End: 2015-05-23
  Administered 2015-05-23: 22:00:00 via ORAL

## 2015-05-23 MED FILL — ACETAMINOPHEN 325 MG TABLET: 325 mg | ORAL | Qty: 3

## 2015-05-23 NOTE — ED Notes (Signed)
Report received from Wilhemina CashKelly B RN assumed care of pt. Pt resting comfortably in stretcher with boyfriend at bedside. Pt remains 1:1 observation with a sitter at bedside.  Updated pt on plan of care awaiting test results. Pt verbalized understanding, warm blankets provided to pt for comfort, bed in lowest lock position, side rails up x2, call bell in reach of pt and pt instructed to use call bell for assistance.

## 2015-05-23 NOTE — ED Provider Notes (Signed)
[place] [address] [facility]  EMERGENCY DEPARTMENT HISTORY AND PHYSICAL EXAM       Date: 05/23/2015   Patient Name: Candace Wheeler   Date of Birth: [birth year]  Medical Record Number: [id number]    History of Presenting Illness     Chief Complaint   Patient presents with   ??? Mental Health Problem        History Provided By:  patient    Additional History:   1:38 PM  Candace Wheeler is a 54 y.o. female with a hx of depression and hypothyroid who presents to the emergency department C/o anxiety and depression. Associated symptoms include sleep disturbance, decreased concentration, and fatigue. Pt reports she is not able to eat or drink, she has not been sleeping, and has not been wanting to get out of bed. She states she has been under increased stress over the last month due to life stressors that include bills, her father is sick, fighting with son, and her boyfriend is having legal problems. Pt reports that her anxiety has been so bad her hands shake and has never reached this point. She reports she just wants to "go to sleep and not wake up." Pt states she  "wants to be at peace, I cant deal with things anymore, and I'm very depressed." Pt reports she was taking anti-depressants 7 years ago. She states she feels safe at home and has a good support system at home. Pt reports she is willing to have treatment for her depression. She states she has been without Synthroid for 1 month. Positive stress test a few years ago. Pt denies plan for self injury, suicidal ideations/homicidal ideations, abuse at home, and any other symptoms or complaints.     Primary Care Provider: None   Specialist:    Past History     Past Medical History:   Past Medical History   Diagnosis Date   ??? Hypothyroidism    ??? Psychiatric disorder      anxiety        Past Surgical History:   Past Surgical History   Procedure Laterality Date   ??? Hx cholecystectomy     ??? Hx gyn       hysterectomy   ??? Hx appendectomy          Family History:    History reviewed. No pertinent family history.     Social History:   Social History   Substance Use Topics   ??? Smoking status: Current Every Day Smoker     Packs/day: 1.00   ??? Smokeless tobacco: None   ??? Alcohol use No        Allergies:   No Known Allergies     Review of Systems   Review of Systems   Constitutional: Positive for appetite change, fatigue and unexpected weight change.   Neurological: Positive for tremors (hands).   Psychiatric/Behavioral: Positive for decreased concentration and sleep disturbance. Negative for self-injury and suicidal ideas. The patient is nervous/anxious.         Negative homicidal ideations.    All other systems reviewed and are negative.      Physical Exam  Vitals:    05/23/15 1302 05/23/15 1654 05/23/15 1706 05/23/15 1933   BP: 141/69 115/64  114/53   Pulse: 95 73  80   Resp: 18 18  16    Temp: 97.7 ??F (36.5 ??C)  98.2 ??F (36.8 ??C)    SpO2: 100% 100%  100%   Weight: 54.4  kg (120 lb)      Height: 5\' 5"  (1.651 m)          Physical Exam   Nursing note and vitals reviewed.    Vital signs and nursing notes reviewed.    CONSTITUTIONAL: Alert. Well-appearing; well-nourished; in no apparent distress.  HEAD: Normocephalic; atraumatic.  EYES: PERRL; EOM's intact. No nystagmus. Conjunctiva clear.   ENT: Moist mucus membranes.  NECK: Supple; FROM without difficulty, non-tender; no cervical lymphadenopathy.             CV: Normal S1, S2; no murmurs, rubs, or gallops. No chest wall tenderness.  RESPIRATORY: Normal chest excursion with respiration; breath sounds clear and equal bilaterally; no wheezes, rhonchi, or rales.  EXT: Normal ROM in all four extremities; non-tender to palpation.  SKIN: Normal for age and race; warm; dry; good turgor; no apparent lesions or exudate.  NEURO: A & O x3. Cranial nerves 2-12 intact. Motor 5/5 bilaterally. Sensation intact.   PSYCH:  Depressed mood, flat affect, no auditory/visual hallucinations,  speech normal, thought content normal, no suicidal/homicidal ideations, or plan.     Diagnostic Study Results     Labs -      No results found for this or any previous visit (from the past 12 hour(s)).    Medical Decision Making   I am the first provider for this patient.     I reviewed the vital signs, available nursing notes, past medical history, past surgical history, family history and social history.     Vital Signs-Reviewed the patient's vital signs.   No data found.      Pulse Oximetry Analysis - Normal 100% on room air     Cardiac Monitor:   Rate: 83 bpm  Rhythm: Normal Sinus Rhythm     EKG interpretation: (Preliminary)  NSR, 83 bpm, normal ECG  EKG read by Zebedee IbaMarica Betoney, PA-C    Old Medical Records: Nursing notes.     Medications Given in the ED:  Medications   acetaminophen (TYLENOL) tablet 975 mg (975 mg Oral Given 05/23/15 1651)       PROGRESS NOTE:   1:38 PM  Initial assessment performed.    CONSULT NOTE:   1:57 PM  Zebedee IbaMarica Betoney, PA-C spoke with Charon via telephone consult  Specialty: [facility]  She states she will come to evaluate the pt.     CONSULT NOTE:   3:15 PM  Zebedee IbaMarica Betoney, PA-C spoke with Charon  Specialty: [facility]  She evaluated pt and pt is going to be voluntarily admitted to crisis stabilization. She is not detainable, not suicidal, but is appropriate for inpatient admission, per pt request.     PROGRESS NOTE:   4:52 PM  Pt has been re-examined by Gweneth DimitriMarica J Betoney, PA. Pt is aware that it may take several hours for placement by [facility], but she wants to wait.   Written by Gweneth DimitriMarica J Betoney, PA.    BEDSIDE TRANSFER OF CARE:  5:06 PM  Patient care will be transferred from [facility] Bet[facility], PA-C  to Lear CorporationHannah Bella Brummet, PA-C .  Discussed available diagnostic results and care plan at length at beside. Patient and family made aware of provider change. All questions answered. Patient and family voiced understanding.  Written by Lynett GrimesShajeha Zafar, ED Scribe, as dictated by Zebedee IbaMarica Betoney, PA-C .      7:44 PM Discussed patient's history, exam, and available diagnostics results with Junious Dresseronnie, from [facility], who states [facility], felt the pt would be  able to contract for safety, discharge and follow up. She will have a bed for inpatient treatment at [facility] crisis stabilization unit at 5pm tomorrow. Will give the pt information for the [facility] crisis stabilization unit and [facility] if she has any questions for tomorrow .     7:51 PM Pt has been reexamined.  Patient has no new complaints, changes, or physical findings.  Care plan outlined and precautions discussed.  Results were reviewed with the patient. All medications were reviewed with the patient; will d/c home. All of pt's questions and concerns were addressed. Patient was instructed and agrees to follow up with [facility] who will admit her to [facility] crisis stabilization unit, as well as to return to the ED upon further deterioration. Patient is ready to go home.     Labs Reviewed   URINALYSIS W/ RFLX MICROSCOPIC - Abnormal; Notable for the following:        Result Value    Ketone 15 (*)     All other components within normal limits   METABOLIC PANEL, BASIC - Abnormal; Notable for the following:     Potassium 3.4 (*)     BUN/Creatinine ratio 10 (*)     All other components within normal limits   CARDIAC PANEL,(CK, CKMB & TROPONIN) - Abnormal; Notable for the following:     CK - MB <0.5 (*)     All other components within normal limits   TSH 3RD GENERATION - Abnormal; Notable for the following:     TSH 5.88 (*)     All other components within normal limits   CBC WITH AUTOMATED DIFF   DRUG SCREEN, URINE   ETHYL ALCOHOL   T4, FREE       No results found for this or any previous visit (from the past 12 hour(s)).    CLINICAL IMPRESSION    1. Other depression        _______________________________   Attestations:     This note is prepared by Lula Olszewski, acting as scribe for Zebedee Iba, PA-C at 1:38 PM  on 05/23/2015     Zebedee Iba, PA-C: The scribe's documentation has been prepared under my direction and personally reviewed by me and its entirety.     _______________________________

## 2015-05-23 NOTE — ED Notes (Signed)
Assumed care of patient, [facility], Pt remains on 1 to 1 watch. Pt is alert and oriented, cooperative. Pt belongings remain secured at the bedside.

## 2015-05-23 NOTE — ED Notes (Signed)
Pt was discharged in good and improved condition. The patient's diagnosis, condition and treatment were explained to patient and aftercare instructions were given. The patient verbalized good understanding. Patient armband removed and both labels and armband were placed in shred bin. Patient left ER ambulatory with steady gait in no acute distress. Patient provided education about follow up plan and appointments. Patient verbalized understanding.

## 2015-05-23 NOTE — ED Notes (Signed)
Pt reports that her headache is better. Pt remains on 1 to 1 watch. Pt boyfriend has brought additional clothing for the patient. This rn places the clothing in patient belonging bags, labeled and stored at the nurses station.

## 2015-05-23 NOTE — ED Notes (Signed)
Provider made aware that pt is requesting something for her headache.

## 2015-05-23 NOTE — ED Notes (Signed)
RN assumed care of this patient at this time. Patient reports she not having thoughts of harming self just would like to go to sleep and never wake up. Reports her brother died and has contributed to some of her depression. Her father has failing health issues. Reports she has trouble with bills. Patient cooperataive at this time.

## 2015-05-23 NOTE — ED Notes (Signed)
Chart accessed for auditing purposes.

## 2015-05-23 NOTE — ED Notes (Signed)
1450 patient with [facility]sb and would like to talk to patient without a sitter.

## 2015-05-23 NOTE — ED Notes (Signed)
[facility] worker who just left the patients bedside informed this RN that patient will be voluntary, she is not homicidal or suicidal. Plan of care is to get her in with Crisis intervention, however it may not be until tomorrow. Per [facility] worker patient is appropriate to be discharged home and to go to crisis intervention by private vehicle if the bed is not available until tomorrow.  This rn informs the [facility] worker to inform the provider of the plan of care as well.

## 2015-05-23 NOTE — ED Triage Notes (Addendum)
Triage note: pt reports 3 days of not sleeping, eating, "my nerves are shot"  , depression, anxiety. Pt denies suicidal ideation and homicidal ideation. Pt reports she has been off her synthroid for about 1 month.   Sepsis Screening completed    (  )Patient meets SIRS criteria.  (  x)Patient does not meet SIRS criteria.      SIRS Criteria is achieved when two or more of the following are present  ? Temperature < 96.8??F (36??C) or > 100.9??F (38.3??C)  ? Heart Rate > 90 beats per minute  ? Respiratory Rate > 20 beats per minute  ? WBC count > 12,000 or <4,000 or > 10% bands

## 2015-05-23 NOTE — ED Notes (Signed)
Bedside report complete. Patient was introduced to oncoming  Royetta CarKelly Beaumeir, Charity fundraiserN. Patient updated on plan of care. Safety check performed: Bed locked in low posiiton. Side rails up. Call bell and personal items within reach. Lolita at bedside with 1:1

## 2015-05-23 NOTE — ED Notes (Signed)
Beside report given to Morrie SheldonAshley and Engineer, waterMelissa RN.

## 2015-05-23 NOTE — ED Notes (Signed)
Pt remains on  1 to 1 watch, pt eating food tray.

## 2015-05-24 LAB — T4, FREE: T4, Free: 1.1 NG/DL (ref 0.7–1.5)

## 2015-05-24 LAB — TSH 3RD GENERATION: TSH: 5.88 u[IU]/mL — ABNORMAL HIGH (ref 0.36–3.74)

## 2015-05-27 LAB — EKG, 12 LEAD, INITIAL
Atrial Rate: 83 {beats}/min
Calculated P Axis: 65 degrees
Calculated R Axis: 54 degrees
Calculated T Axis: 60 degrees
Diagnosis: NORMAL
P-R Interval: 168 ms
Q-T Interval: 360 ms
QRS Duration: 86 ms
QTC Calculation (Bezet): 423 ms
Ventricular Rate: 83 {beats}/min

## 2015-12-11 IMAGING — CR DG LUMBAR SPINE COMPLETE 4+V
5 series · 5 of 5 positions shown · non-contrast
Comparison: None.

CLINICAL DATA: Fall today.  Back pain

EXAM:
LUMBAR SPINE - COMPLETE 4+ VIEW

[l-spine ap]
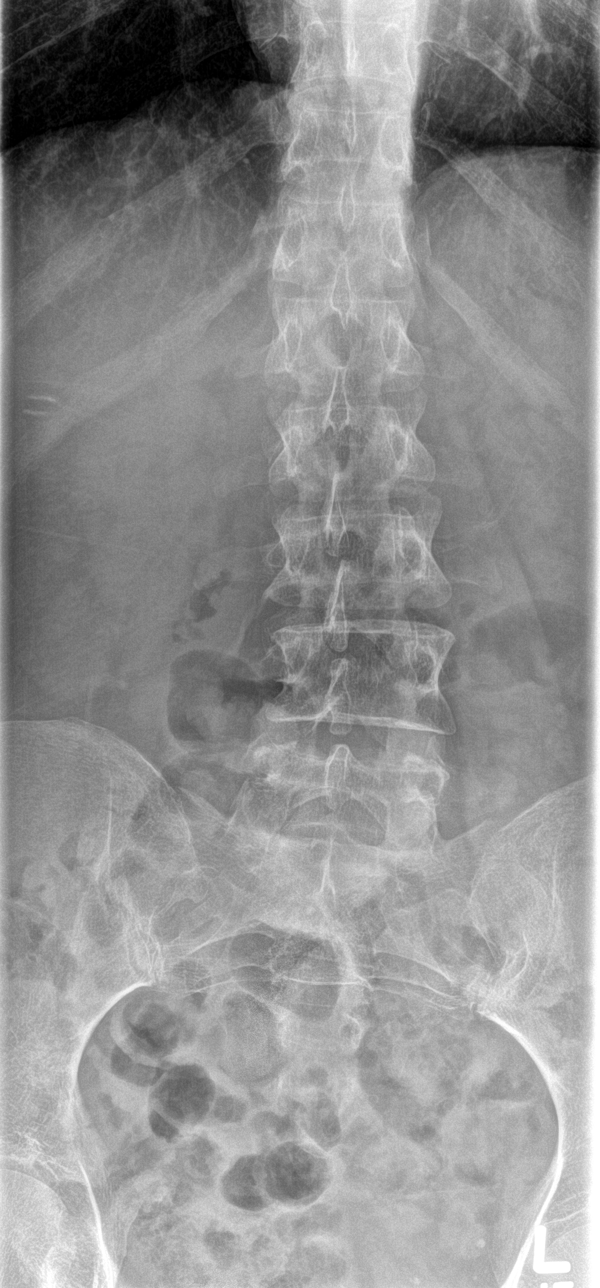

[l-spine obl (1 of 2)]
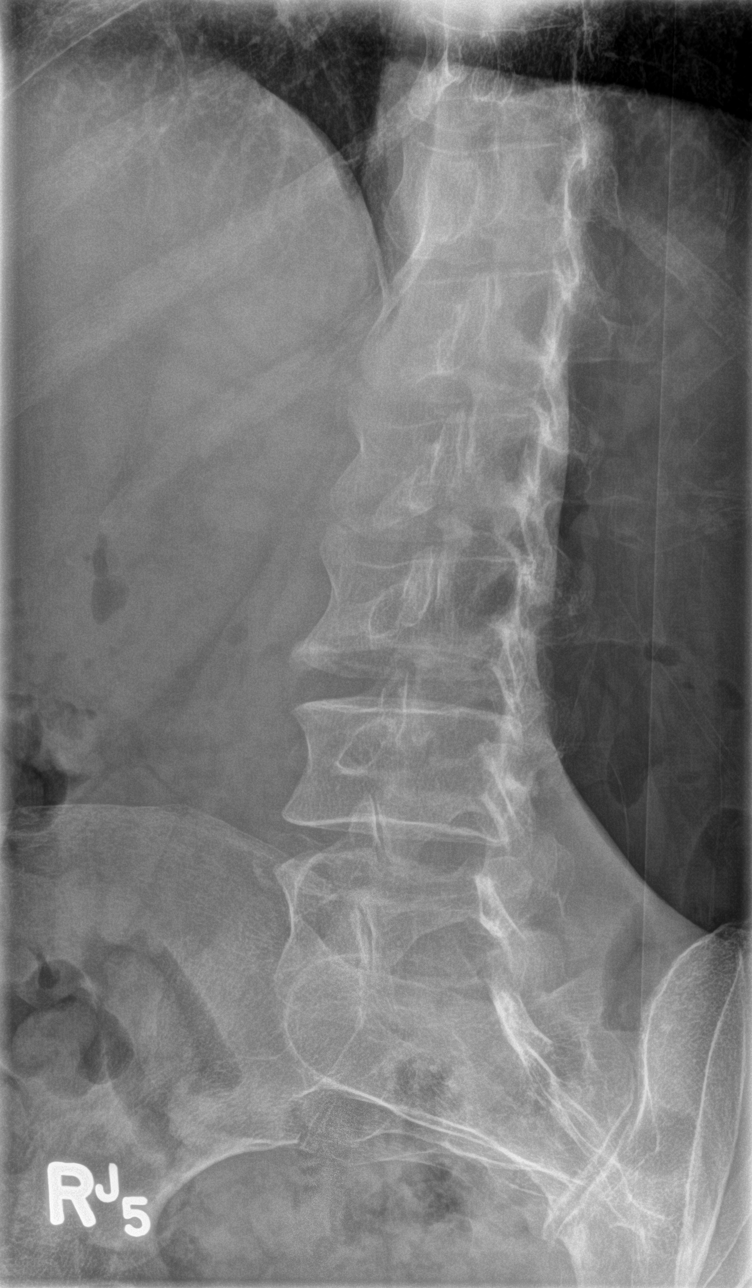

[l-spine obl (2 of 2)]
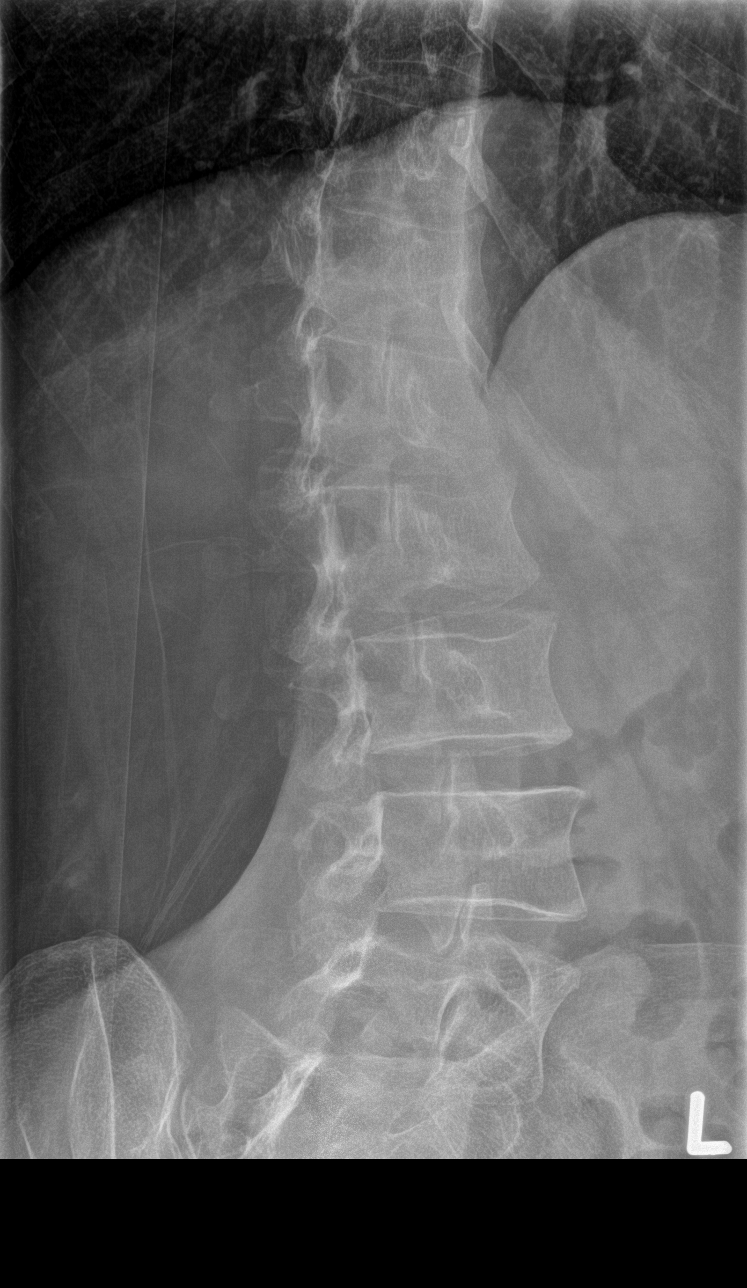

[l-spine lat]
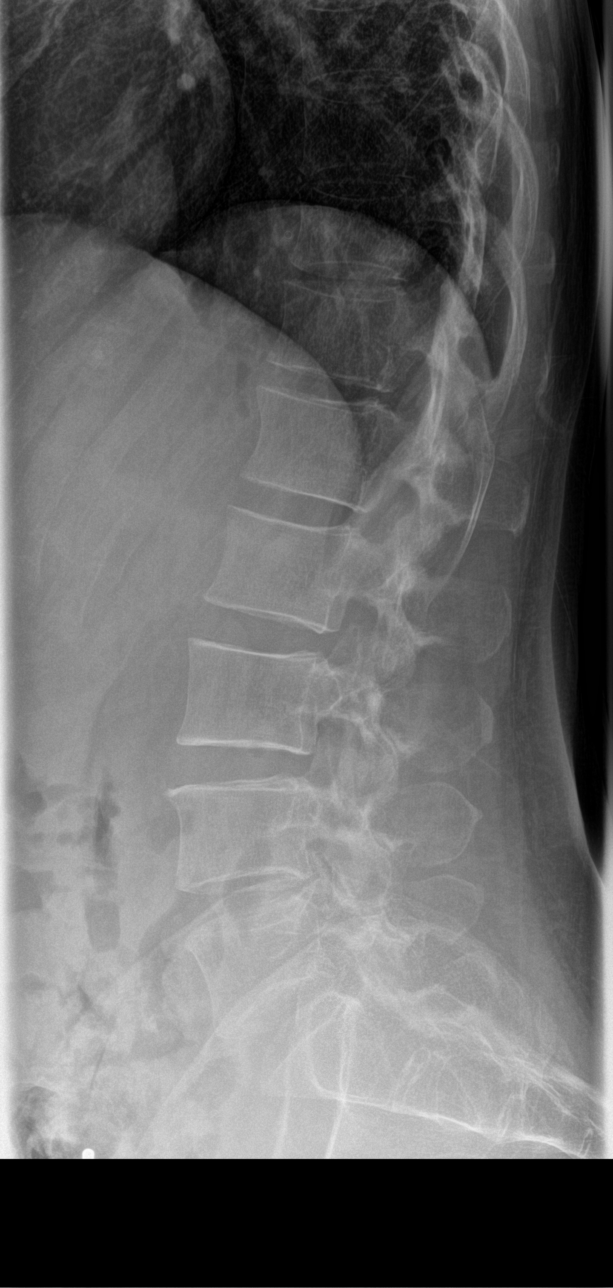

[l-spine spot]
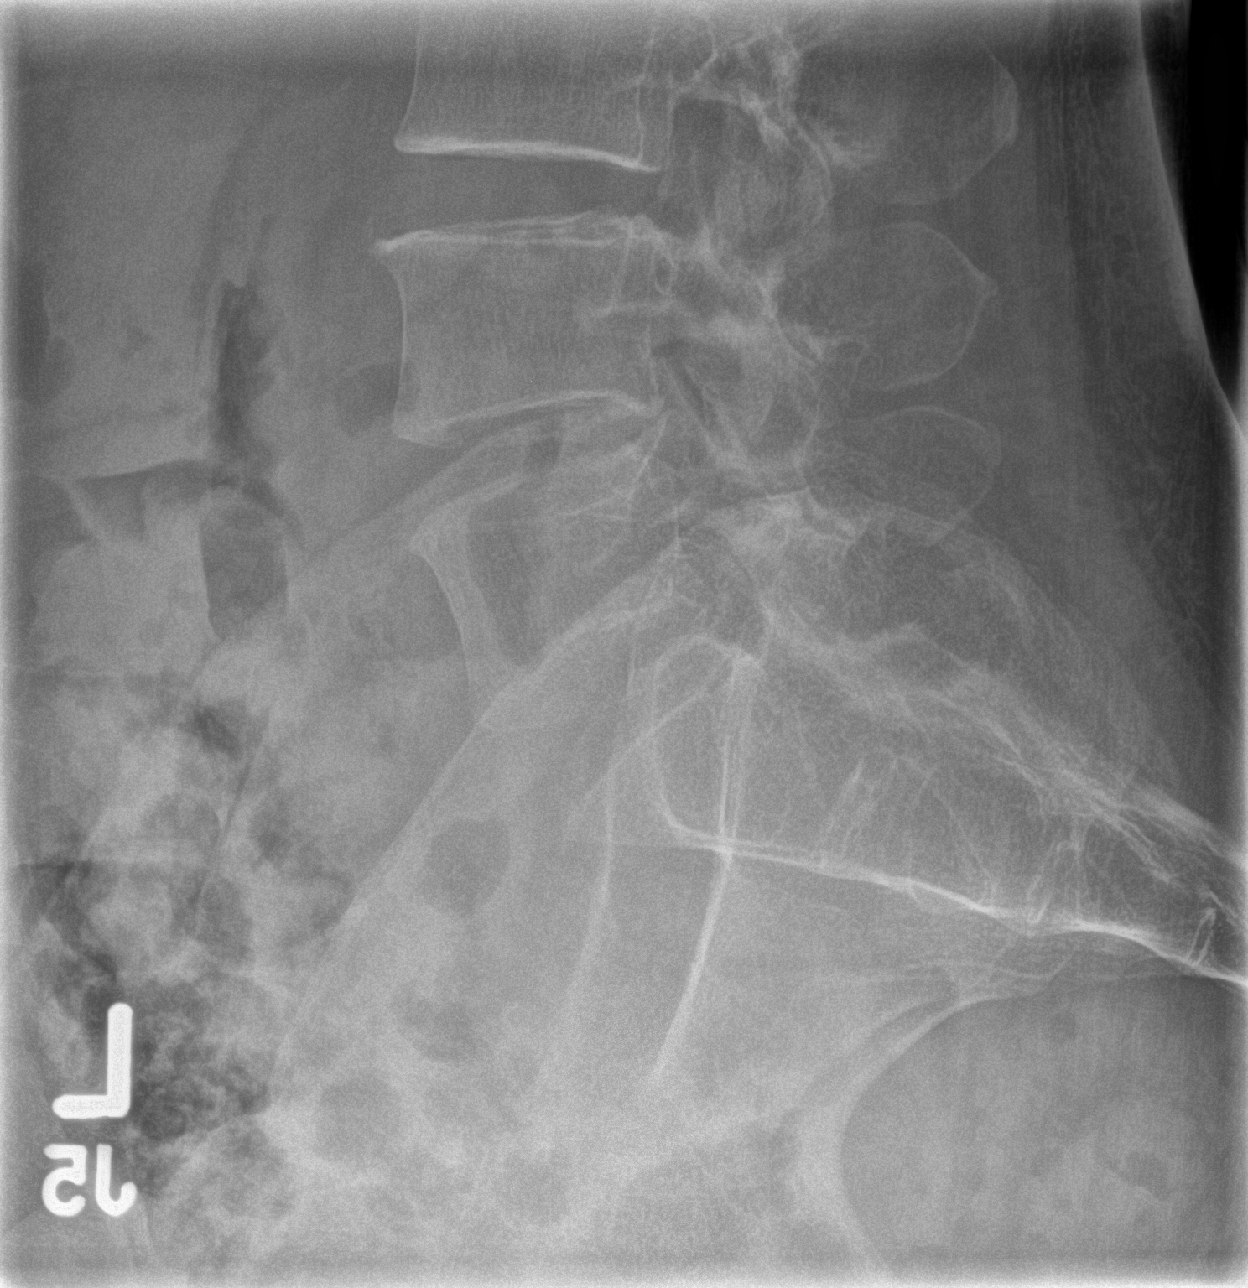

[5 of 5 positions shown; findings below may reference images not displayed]

FINDINGS: Mild levoscoliosis at L4. Normal sagittal alignment. No fracture or
pars defect. Disc spaces are well maintained without significant
disc space narrowing.
IMPRESSION: Negative.

## 2015-12-11 IMAGING — CT CT HEAD W/O CM
1 series · 16 of 29 positions shown, 20 images · non-contrast
Comparison: None.

CLINICAL DATA: Fall at work

EXAM:
CT HEAD WITHOUT CONTRAST
TECHNIQUE: Contiguous axial images were obtained from the base of the skull
through the vertex without intravenous contrast.

[Series 2: head 5.0 h30s · axial · 0.42mm/px · z∈[+1531,+1661]mm · 16 of 29 slices shown, 20 images]
[im 2/29  brain]
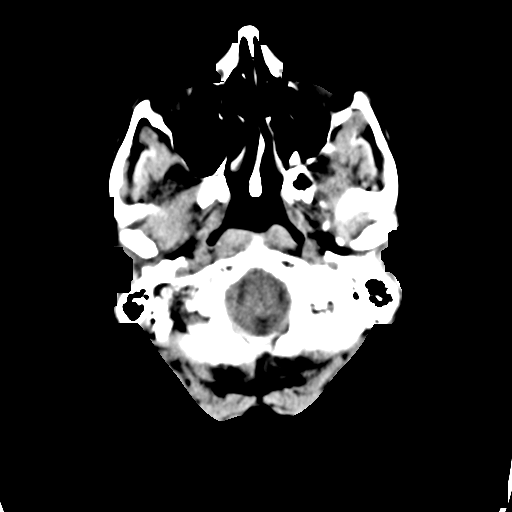
[im 2/29  bone]
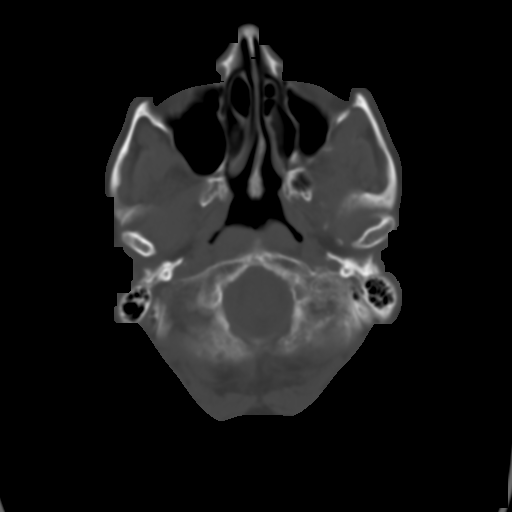
[im 4/29  brain]
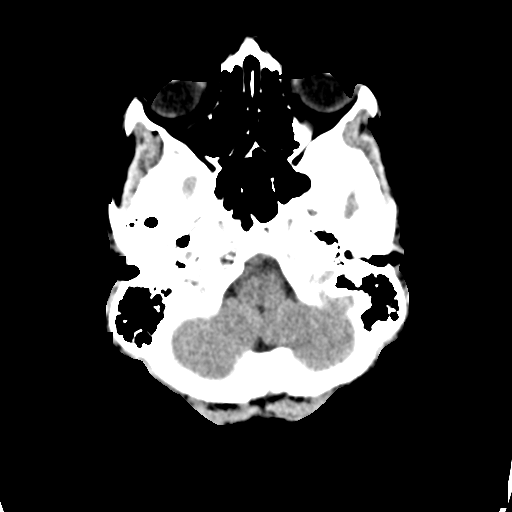
[im 6/29  brain]
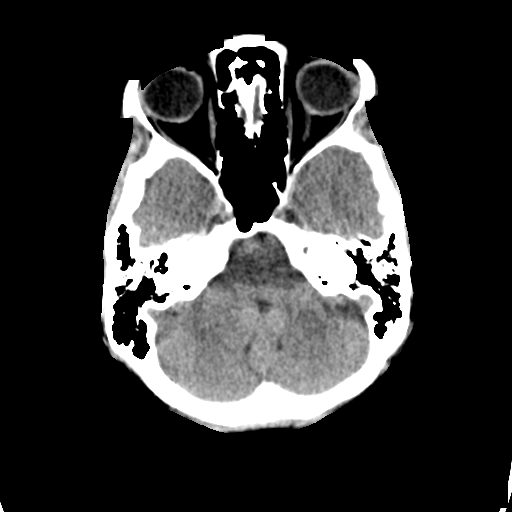
[im 7/29  brain]
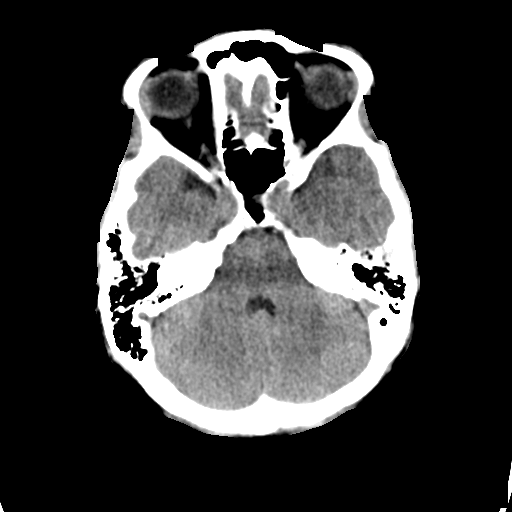
[im 9/29  brain]
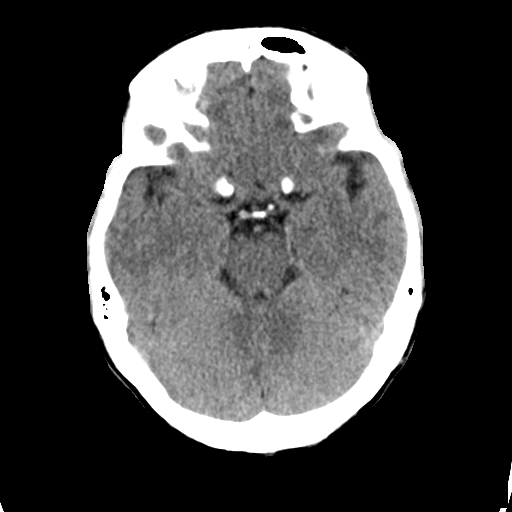
[im 9/29  bone]
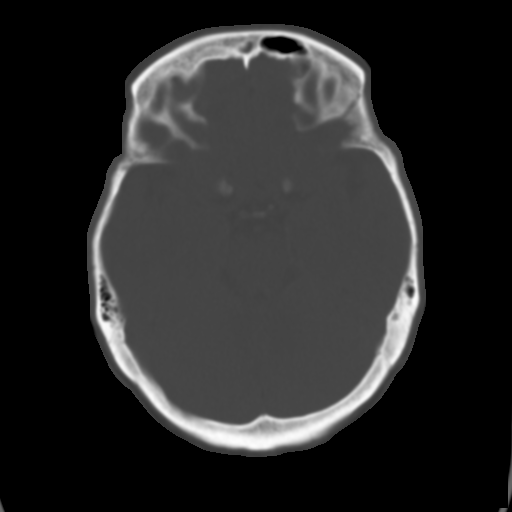
[im 11/29  brain]
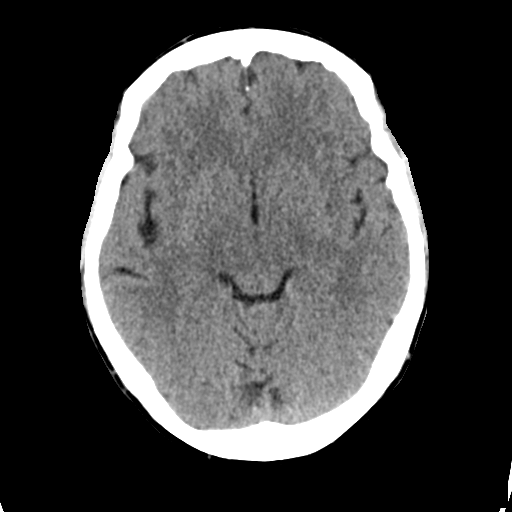
[im 12/29  brain]
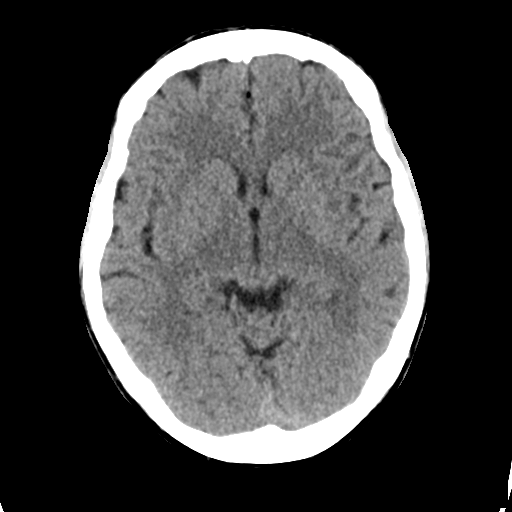
[im 14/29  brain]
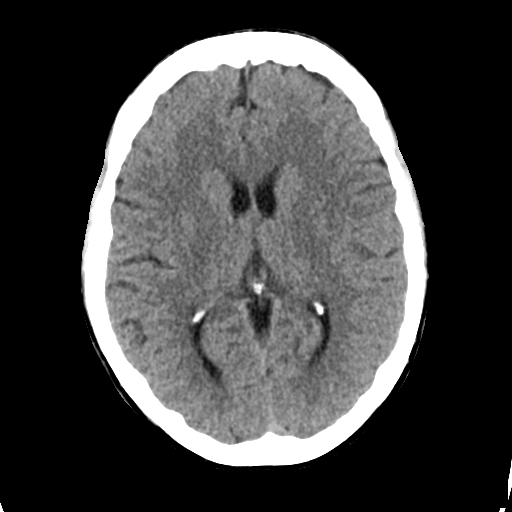
[im 16/29  brain]
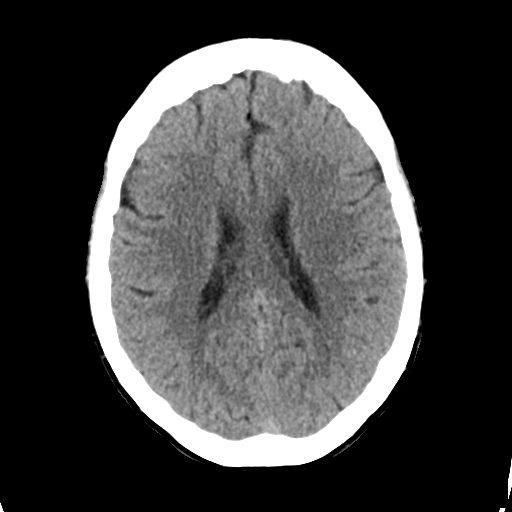
[im 16/29  bone]
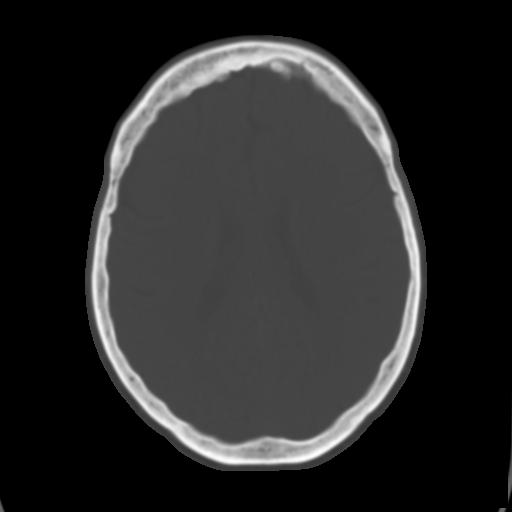
[im 18/29  brain]
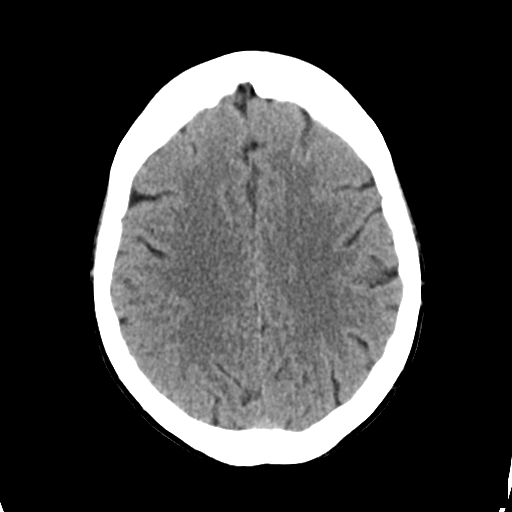
[im 19/29  brain]
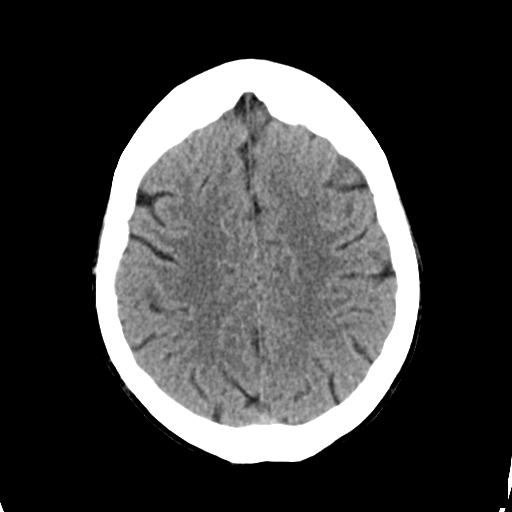
[im 21/29  brain]
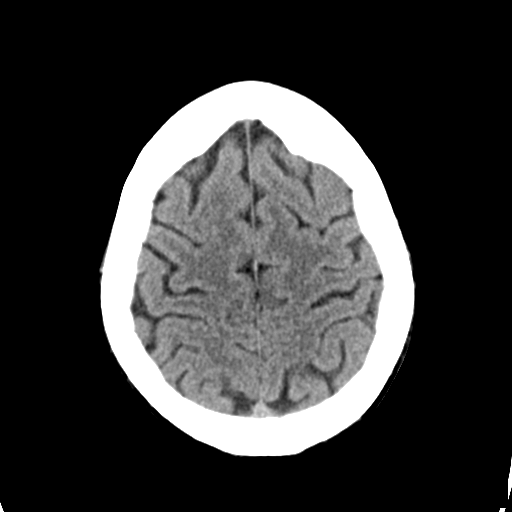
[im 23/29  brain]
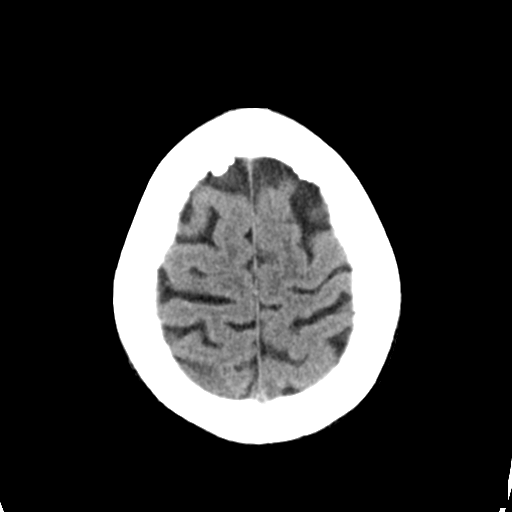
[im 23/29  bone]
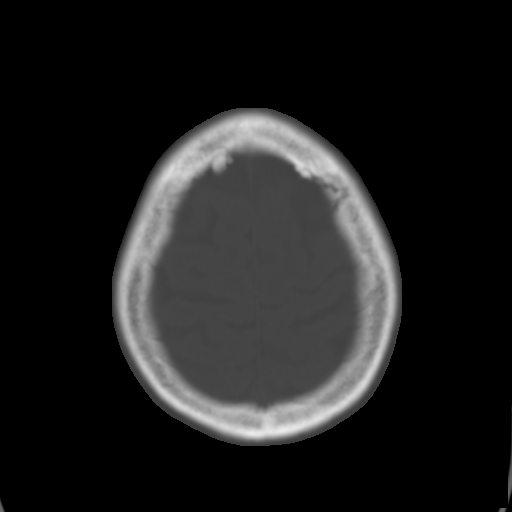
[im 24/29  brain]
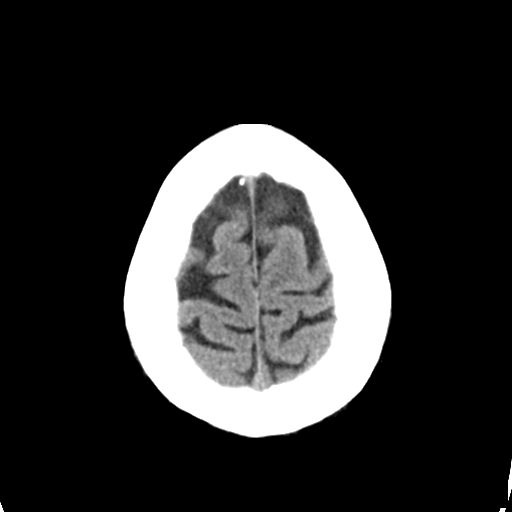
[im 26/29  brain]
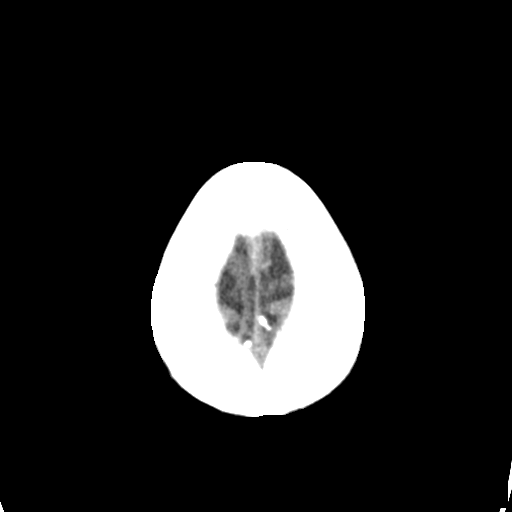
[im 28/29  brain]
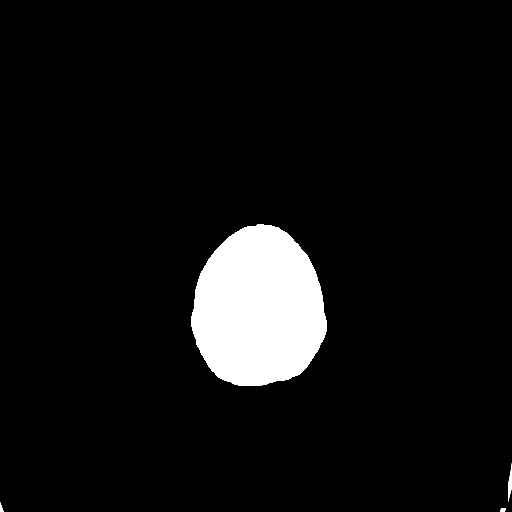

[16 of 29 positions shown; findings below may reference images not displayed]

FINDINGS: No skull fracture is noted. Paranasal sinuses and mastoid air cells
are unremarkable. No intracranial hemorrhage, mass effect or midline
shift. No acute infarction. No hydrocephalus. No mass lesion is
noted on this unenhanced scan. The gray and white-matter
differentiation is preserved.
IMPRESSION: No acute intracranial abnormality.

## 2016-10-30 ENCOUNTER — Inpatient Hospital Stay
Admit: 2016-10-30 | Discharge: 2016-10-30 | Disposition: A | Discharge status: Home / Self Care | Payer: Self-pay | Attending: Internal Medicine

## 2016-10-30 DIAGNOSIS — R51 Headache: Secondary | ICD-10-CM

## 2016-10-30 LAB — URINALYSIS W/ RFLX MICROSCOPIC
Bilirubin: NEGATIVE
Blood: NEGATIVE
Glucose: NEGATIVE mg/dL
Ketone: NEGATIVE mg/dL
Leukocyte Esterase: NEGATIVE
Nitrites: NEGATIVE
Protein: NEGATIVE mg/dL
Specific gravity: 1.008 (ref 1.005–1.030)
Urobilinogen: 0.2 EU/dL (ref 0.2–1.0)
pH (UA): 7 (ref 5.0–8.0)

## 2016-10-30 MED ORDER — ONDANSETRON 4 MG TAB, RAPID DISSOLVE
4 mg | ORAL | Status: AC
Start: 2016-10-30 — End: 2016-10-30
  Administered 2016-10-30: 14:00:00 via ORAL

## 2016-10-30 MED ORDER — BUTALBITAL-ACETAMINOPHEN-CAFFEINE 50 MG-300 MG-40 MG CAPSULE
50-300-40 mg | ORAL_CAPSULE | Freq: Four times a day (QID) | ORAL | 0 refills | Status: DC | PRN
Start: 2016-10-30 — End: 2016-12-22

## 2016-10-30 MED ORDER — ONDANSETRON HCL 4 MG TAB
4 mg | ORAL_TABLET | Freq: Three times a day (TID) | ORAL | 0 refills | Status: DC | PRN
Start: 2016-10-30 — End: 2016-12-22

## 2016-10-30 MED ORDER — BUTALBITAL-ACETAMINOPHEN-CAFFEINE 50 MG-325 MG-40 MG TAB
50-325-40 mg | ORAL | Status: AC
Start: 2016-10-30 — End: 2016-10-30
  Administered 2016-10-30: 14:00:00 via ORAL

## 2016-10-30 MED ORDER — FLUTICASONE 50 MCG/ACTUATION NASAL SPRAY, SUSP
50 mcg/actuation | Freq: Every day | NASAL | 0 refills | Status: DC
Start: 2016-10-30 — End: 2016-12-22

## 2016-10-30 MED FILL — ONDANSETRON 4 MG TAB, RAPID DISSOLVE: 4 mg | ORAL | Qty: 1

## 2016-10-30 MED FILL — BUTALBITAL-ACETAMINOPHEN-CAFFEINE 50 MG-325 MG-40 MG TAB: 50-325-40 mg | ORAL | Qty: 1

## 2016-10-30 NOTE — ED Provider Notes (Signed)
EMERGENCY DEPARTMENT HISTORY AND PHYSICAL EXAM    Date: 10/30/2016  Patient Name: Candace Wheeler    History of Presenting Illness     Chief Complaint   Patient presents with   ??? Headache   ??? Pelvic Pain     LEFT side   ??? Chest Congestion     History Provided By: Patient    Chief Complaint: Headache  Duration: ~1-2 Weeks  Timing:  Intermittent  Location: Head  Quality: N/A  Severity: 10 out of 10  Modifying Factors: No relief with Aleve or Advil  Associated Symptoms: Neck pain, tinnitus, nausea, decreased appetite, congestion, sneezing, and fatigue   Other Symptoms: Pelvic pain and back pain    Additional History (Context):   9:19 AM    Candace Wheeler is a 56 y.o. female with PMHX of sciatica, depression, anxiety, hysterectomy, ovarian cyst, hiatal hernia, and hypothyroidism who presents to the emergency department C/O 10/10 intermittent HA radiating down the back of her head and neck, onset ~1-2 weeks ago. Associated sxs include tinnitus, nausea, decreased appetite, congestion, sneezing, and fatigue. Pt notes that HA is similar to her hx of migraines and there is no relief with Aleve or Advil. Pt also C/O intermittent left sided pelvic pain radiating to her back. Pt states that she has recently been under a lot of stress. Endorses tobacco use. Pt denies any concern for STD, EtOH/illicit drug use, dysuria, vaginal bleeding, vaginal discharge, SI/HI, fever, and any other sxs or complaints.     PCP: Phys Other, MD    Current Outpatient Prescriptions   Medication Sig Dispense Refill   ??? levothyroxine (SYNTHROID) 88 mcg tablet Take 88 mcg by mouth Daily (before breakfast).     ??? esomeprazole magnesium (NEXIUM PO) Take  by mouth.     ??? fluticasone (FLONASE) 50 mcg/actuation nasal spray 2 Sprays by Both Nostrils route daily. 1 Bottle 0   ??? butalbital-acetaminophen-caff (FIORICET) 50-300-40 mg per capsule Take 1 Cap by mouth every six (6) hours as needed for Pain. 10 Cap 0    ??? ondansetron hcl (ZOFRAN) 4 mg tablet Take 1 Tab by mouth every eight (8) hours as needed for Nausea. 10 Tab 0   ??? amoxicillin (AMOXIL) 500 mg capsule Take 500 mg by mouth three (3) times daily.         Past History     Past Medical History:  Past Medical History:   Diagnosis Date   ??? Hypothyroidism    ??? Psychiatric disorder     anxiety       Past Surgical History:  Past Surgical History:   Procedure Laterality Date   ??? HX APPENDECTOMY     ??? HX CHOLECYSTECTOMY     ??? HX GYN      hysterectomy   ??? HX TUBAL LIGATION         Family History:  History reviewed. No pertinent family history.    Social History:  Social History   Substance Use Topics   ??? Smoking status: Current Every Day Smoker     Packs/day: 1.00   ??? Smokeless tobacco: Never Used   ??? Alcohol use No       Allergies:  No Known Allergies      Review of Systems   Review of Systems   Constitutional: Positive for appetite change (decreased) and fatigue. Negative for fever.   HENT: Positive for congestion, sneezing and tinnitus.    Gastrointestinal: Positive for nausea.   Genitourinary: Positive for  pelvic pain. Negative for dysuria, vaginal bleeding and vaginal discharge.   Musculoskeletal: Positive for back pain and neck pain.   Neurological: Positive for headaches.   Psychiatric/Behavioral: Negative for suicidal ideas.   All other systems reviewed and are negative.      Physical Exam     Vitals:    10/30/16 0911 10/30/16 0916 10/30/16 0951   BP: 130/60     Pulse: 73     Resp: 18     Temp:   98.3 ??F (36.8 ??C)   SpO2: 100% 100%    Weight: 57.2 kg (126 lb)     Height:  (1.651 m)       Physical Exam   Constitutional: She is oriented to person, place, and time. She appears well-developed and well-nourished.   HENT:   Head: Normocephalic and atraumatic.   Right Ear: Hearing and tympanic membrane normal.   Left Ear: Hearing and tympanic membrane normal.   Nose:       Mouth/Throat:       Eyes: Conjunctivae and EOM are normal. Pupils are equal, round, and  reactive to light.   Neck: Normal range of motion.   No rigidity or tenderness noted    Cardiovascular: Normal rate and regular rhythm.    No murmur heard.  Pulmonary/Chest: Effort normal and breath sounds normal.   Abdominal: Soft. Bowel sounds are normal. There is no tenderness.   Musculoskeletal: Normal range of motion.   Strong equal strength all extremities    Neurological: She is alert and oriented to person, place, and time. No cranial nerve deficit. Coordination normal.   Skin: Skin is warm and dry.   Nursing note and vitals reviewed.        Diagnostic Study Results     Labs -     Recent Results (from the past 12 hour(s))   URINALYSIS W/ RFLX MICROSCOPIC    Collection Time: 10/30/16  9:20 AM   Result Value Ref Range    Color YELLOW      Appearance CLEAR      Specific gravity 1.008 1.005 - 1.030      pH (UA) 7.0 5.0 - 8.0      Protein NEGATIVE  NEG mg/dL    Glucose NEGATIVE  NEG mg/dL    Ketone NEGATIVE  NEG mg/dL    Bilirubin NEGATIVE  NEG      Blood NEGATIVE  NEG      Urobilinogen 0.2 0.2 - 1.0 EU/dL    Nitrites NEGATIVE  NEG      Leukocyte Esterase NEGATIVE  NEG         Radiologic Studies -   No orders to display     CT Results  (Last 48 hours)    None        CXR Results  (Last 48 hours)    None          Medications given in the ED-  Medications   butalbital-acetaminophen-caffeine (FIORICET, ESGIC) 50-325-40 mg per tablet 1 Tab (1 Tab Oral Given 10/30/16 0949)   ondansetron (ZOFRAN ODT) tablet 4 mg (4 mg Oral Given 10/30/16 0949)         Medical Decision Making   I am the first provider for this patient.    I reviewed the vital signs, available nursing notes, past medical history, past surgical history, family history and social history.    Vital Signs-Reviewed the patient's vital signs.    Pulse Oximetry Analysis - 100% on  RA     Records Reviewed: Nursing Notes    Provider Notes (Medical Decision Making): Patient presents with multiple vague medical complaints. Patient is under alot of stress but denies  homicidal or suicidal ideation and is appropriate. Headache decreased with medications and neurologically she is intact. Pelvic pain is intermittent and she denies at this time, also denies vaginal discharge or possibility of STD, does have history of ovarian cyst. Patient will need outpatient following but do not believe this needs to happen emergency lt today and patient agrees. She understands reasons to return and is offering no questions or concerns at this time     Procedures:  Procedures    ED Course:   9:19 AM Initial assessment performed. The patients presenting problems have been discussed, and they are in agreement with the care plan formulated and outlined with them.  I have encouraged them to ask questions as they arise throughout their visit.    10:12 AM Pt reports feeling much better after medications given. Updated on results of UA. Continues to not have any pelvic pain. Will move up appointment to possibly be started on anti-depressants sooner. Continues to deny SI/HI. Agrees no further imaging is warranted at this time. Believes HAs are from stress. Understands reasons to return and is offering no questions or concerns at this time.     Diagnosis and Disposition       DISCHARGE NOTE:  10:19 AM   Marry Guan Ofarrell's  results have been reviewed with her.  She has been counseled regarding her diagnosis, treatment, and plan.  She verbally conveys understanding and agreement of the signs, symptoms, diagnosis, treatment and prognosis and additionally agrees to follow up as discussed.  She also agrees with the care-plan and conveys that all of her questions have been answered.  I have also provided discharge instructions for her that include: educational information regarding their diagnosis and treatment, and list of reasons why they would want to return to the ED prior to their follow-up appointment, should her condition change. She has  been provided with education for proper emergency department utilization.     CLINICAL IMPRESSION:    1. Nonintractable episodic headache, unspecified headache type    2. Nasal congestion        PLAN:  1. D/C Home  2.   Current Discharge Medication List      START taking these medications    Details   fluticasone (FLONASE) 50 mcg/actuation nasal spray 2 Sprays by Both Nostrils route daily.  Qty: 1 Bottle, Refills: 0      butalbital-acetaminophen-caff (FIORICET) 50-300-40 mg per capsule Take 1 Cap by mouth every six (6) hours as needed for Pain.  Qty: 10 Cap, Refills: 0      ondansetron hcl (ZOFRAN) 4 mg tablet Take 1 Tab by mouth every eight (8) hours as needed for Nausea.  Qty: 10 Tab, Refills: 0           3.   Follow-up Information     Follow up With Details Comments Contact Info    [facility] Call in 1 day For available follow up appointment [address]  [place] [address]  [phone]    [facility] EMERGENCY DEPT Go to As needed, if symptoms worsen [address]  [place] [address]  [phone]        _______________________________    Attestations:  This note is prepared by Duwayne Heck L.Pearla Dubonnet, acting as  Scribe for Marlou Sa, NP.    Marlou Sa, NP:  The scribe's documentation has been prepared under my direction and personally reviewed by me in its entirety.  I confirm that the note above accurately reflects all work, treatment, procedures, and medical decision making performed by me.  _______________________________

## 2016-10-30 NOTE — ED Triage Notes (Signed)
Patient with c/o HA, LEFT sided pelvic pain, and chest congestion. Patient with HA x1 week, reports taking Advil, last admin yesterday with no relief. Patient with LEFT sided pelvic pain, with hx of LEFT cyst, patient reports GYN is monitoring. Hx of hernia and GERD. Patient with chest congestion, reports mucous is clear to gray in color, and non productive cough.     Sepsis Screening completed    (  )Patient meets SIRS criteria.  (x  )Patient does not meet SIRS criteria.      SIRS Criteria is achieved when two or more of the following are present  ? Temperature < 96.8??F (36??C) or > 100.9??F (38.3??C)  ? Heart Rate > 90 beats per minute  ? Respiratory Rate > 20 breaths per minute  ? WBC count > 12,000 or <4,000 or > 10% bands

## 2016-10-30 NOTE — ED Notes (Signed)
Discharge instructions reviewed with the patient with opportunity for questions given. The patient verbalized understanding. Patient armband removed and shredded. Patient alert/ambulatory and in stable condition at time of discharge.

## 2016-12-22 DIAGNOSIS — R51 Headache: Secondary | ICD-10-CM

## 2016-12-22 NOTE — ED Provider Notes (Signed)
EMERGENCY DEPARTMENT HISTORY AND PHYSICAL EXAM    Date: 12/22/2016  Patient Name: Candace Wheeler    History of Presenting Illness     Chief Complaint   Patient presents with   ??? Headache   ??? Nasal Congestion         History Provided By: Patient    Chief Complaint: Headache  Duration: 1 week   Timing:  Gradual and Worsening  Location: Head  Quality: Aching  Severity: 10 out of 10  Modifying Factors: Notes temporary relief with Aleve.   Associated Symptoms: difficulty focusing on work, photophobia, chills, nausea, nasal congestion, and PND    Additional History (Context):   10:15 PM  Candace Wheeler is a 56 y.o. female with PMHx of anxiety and hypothyroidism who presents ambulatory to the emergency department C/O headache that begins in the front and radiates into the back (rated 10/10), onset 1 week ago, worsened today. Notes temporary relief with Aleve. Associated sxs include difficulty focusing on work, photophobia, chills, nausea, nasal congestion, and PND. Gradual onset. Pt typically has frequent headaches and was seen here on 10/30/16 for similar sxs. Pt has never has a CT scan of her head or sinuses. Endorses tobacco use. Denies any h/o HTN or DM. Pt denies fever, vomiting, rhinorrhea, visual changes, SOB, wheezing, illicit drug use, or any other sxs or complaints.     PCP: Phys Other, MD    Current Facility-Administered Medications   Medication Dose Route Frequency Provider Last Rate Last Dose   ??? sodium chloride (NS) flush 5-10 mL  5-10 mL IntraVENous Q8H Nada Libman, MD       ??? sodium chloride (NS) flush 5-10 mL  5-10 mL IntraVENous PRN Nada Libman, MD         Current Outpatient Prescriptions   Medication Sig Dispense Refill   ??? codeine-butalbital-aspirin-caffeine (FIORINAL-CODEINE #3) 30-50-325-40 mg per capsule Take 1 Cap by mouth every six (6) hours as needed for Headache. Max Daily Amount: 4 Caps. Maximum 6 capsules daily 20 Cap 0    ??? levothyroxine (SYNTHROID) 88 mcg tablet Take 88 mcg by mouth Daily (before breakfast).     ??? esomeprazole magnesium (NEXIUM PO) Take  by mouth.         Past History     Past Medical History:  Past Medical History:   Diagnosis Date   ??? Hypothyroidism    ??? Psychiatric disorder     anxiety       Past Surgical History:  Past Surgical History:   Procedure Laterality Date   ??? HX APPENDECTOMY     ??? HX CHOLECYSTECTOMY     ??? HX GYN      hysterectomy   ??? HX TUBAL LIGATION         Family History:  History reviewed. No pertinent family history.    Social History:  Social History   Substance Use Topics   ??? Smoking status: Current Every Day Smoker     Packs/day: 1.00   ??? Smokeless tobacco: Never Used   ??? Alcohol use No       Allergies:  No Known Allergies      Review of Systems   Review of Systems   Constitutional: Positive for chills. Negative for diaphoresis, fever and unexpected weight change.   HENT: Positive for congestion and postnasal drip. Negative for drooling, ear pain, rhinorrhea, sore throat, tinnitus and trouble swallowing.    Eyes: Positive for photophobia. Negative for pain, redness and visual  disturbance.   Respiratory: Positive for cough. Negative for choking, chest tightness, shortness of breath, wheezing and stridor.    Cardiovascular: Negative for chest pain, palpitations and leg swelling.   Gastrointestinal: Positive for nausea. Negative for abdominal distention, abdominal pain, anal bleeding, blood in stool, constipation, diarrhea and vomiting.   Genitourinary: Negative for difficulty urinating, dysuria, flank pain, frequency, hematuria and urgency.   Musculoskeletal: Negative for arthralgias, back pain and neck pain.   Skin: Negative for color change, rash and wound.   Neurological: Positive for headaches. Negative for dizziness, seizures, syncope, speech difficulty and light-headedness.   Hematological: Does not bruise/bleed easily.    Psychiatric/Behavioral: Positive for decreased concentration. Negative for agitation, behavioral problems, hallucinations, self-injury and suicidal ideas. The patient is not hyperactive.        Physical Exam     Vitals:    12/22/16 2022 12/22/16 2323   BP: 137/85 128/77   Pulse: 74 65   Resp: 16 16   Temp: 98.2 ??F (36.8 ??C)    SpO2: 100% 97%   Weight: 56.7 kg (125 lb)    Height: 5' 5.5" (1.664 m)      Physical Exam   Constitutional: She is oriented to person, place, and time. She appears well-developed and well-nourished. No distress.   Uncomfortable appearing, nontoxic   HENT:   Head: Normocephalic and atraumatic.   Right Ear: External ear normal.   Left Ear: External ear normal.   Nose: No mucosal edema.   Mouth/Throat: Oropharynx is clear and moist and mucous membranes are normal. Mucous membranes are not dry. No oropharyngeal exudate.   Sinus erythema without swelling, purulence, or mass   Eyes: Conjunctivae and EOM are normal. Pupils are equal, round, and reactive to light. No scleral icterus. Right eye exhibits no nystagmus. Left eye exhibits no nystagmus.   No pallor, no nystagmus   Neck: Normal range of motion. Neck supple. No JVD present. No tracheal deviation present. No thyromegaly present.   Cardiovascular: Normal rate, regular rhythm and normal heart sounds.    Pulmonary/Chest: Effort normal and breath sounds normal. No stridor. No respiratory distress.   Abdominal: Soft. Bowel sounds are normal. She exhibits no distension. There is no tenderness. There is no rebound and no guarding.   Musculoskeletal: Normal range of motion. She exhibits no edema or tenderness.   No soft tissue injuries   Lymphadenopathy:     She has no cervical adenopathy.   Neurological: She is alert and oriented to person, place, and time. She has normal reflexes. No cranial nerve deficit. Coordination normal.   Skin: Skin is warm and dry. No rash noted. She is not diaphoretic. No erythema.    Psychiatric: She has a normal mood and affect. Her behavior is normal. Judgment and thought content normal.   Nursing note and vitals reviewed.     Diagnostic Study Results     Labs -   No results found for this or any previous visit (from the past 12 hour(s)).    Radiologic Studies -    No orders to display     CT Results  (Last 48 hours)    None        CXR Results  (Last 48 hours)    None            Medical Decision Making   I am the first provider for this patient.    I reviewed the vital signs, available nursing notes, past medical history, past surgical history, family  history and social history.    Vital Signs-Reviewed the patient's vital signs.    Pulse Oximetry Analysis - 100% on RA     Records Reviewed: Nursing Notes    Provider Notes (Medical Decision Making):   Ddx: HA or sinusitis, very typical of migraine like syndrome. Never scanned, we discussed this. She refused scan, do not think this requires AMA; she will get these scans as outpatient studies. She wants to go home and is appropriate for outpatient.    Procedures:  Procedures    MEDICATIONS GIVEN:  Medications   sodium chloride (NS) flush 5-10 mL (not administered)   sodium chloride (NS) flush 5-10 mL (not administered)   sodium chloride 0.9 % bolus infusion 1,000 mL (0 mL IntraVENous IV Completed 12/22/16 2338)   ketorolac (TORADOL) injection 30 mg (30 mg IntraVENous Given 12/22/16 2237)   prochlorperazine (COMPAZINE) injection 10 mg (10 mg IntraVENous Given 12/22/16 2237)   diphenhydrAMINE (BENADRYL) injection 25 mg (25 mg IntraVENous Given 12/22/16 2237)       ED Course:   10:15 PM   Initial assessment performed. The patients presenting problems have been discussed, and they are in agreement with the care plan formulated and outlined with them.  I have encouraged them to ask questions as they arise throughout their visit.    11:41 PM  Pt reports that she is feeling better.     Diagnosis and Disposition     DISCHARGE NOTE:  11:42 PM   Candace Wheeler's  results have been reviewed with her.  She has been counseled regarding her diagnosis, treatment, and plan.  She verbally conveys understanding and agreement of the signs, symptoms, diagnosis, treatment and prognosis and additionally agrees to follow up as discussed.  She also agrees with the care-plan and conveys that all of her questions have been answered.  I have also provided discharge instructions for her that include: educational information regarding their diagnosis and treatment, and list of reasons why they would want to return to the ED prior to their follow-up appointment, should her condition change. She has been provided with education for proper emergency department utilization.       CLINICAL IMPRESSION:    1. Acute nonintractable headache, unspecified headache type        PLAN:  1. D/C Home  2.   Current Discharge Medication List      START taking these medications    Details   codeine-butalbital-aspirin-caffeine (FIORINAL-CODEINE #3) 30-50-325-40 mg per capsule Take 1 Cap by mouth every six (6) hours as needed for Headache. Max Daily Amount: 4 Caps. Maximum 6 capsules daily  Qty: 20 Cap, Refills: 0    Associated Diagnoses: Acute nonintractable headache, unspecified headache type         CONTINUE these medications which have NOT CHANGED    Details   levothyroxine (SYNTHROID) 88 mcg tablet Take 88 mcg by mouth Daily (before breakfast).      esomeprazole magnesium (NEXIUM PO) Take  by mouth.           3.   Follow-up Information     Follow up With Details Comments Contact Info    Your PCP Call in 1 day For primary care follow up     [facility] EMERGENCY DEPT  As needed, If symptoms worsen [address]  [place] [address]  [phone]        _______________________________    Attestations:  This note is prepared by [employer], acting as Scribe for  Duane Lope. Edilia Bo, MD.    Duane Lope. Edilia Bo, MD:  The scribe's documentation has been prepared  under my direction and personally reviewed by me in its entirety.  I confirm that the note above accurately reflects all work, treatment, procedures, and medical decision making performed by me.  _______________________________

## 2016-12-22 NOTE — ED Notes (Signed)
Pt sleeping in lobby with eyes closed.

## 2016-12-22 NOTE — ED Triage Notes (Signed)
Reports headache with nasal congestion for a few days. States pain starts in front of head and radiates into the neck, reports nausea also.

## 2016-12-22 NOTE — ED Notes (Signed)
Pt reports HA x 1 week. Pt reports that Aleve "helps only for a little bit and then the HA comes back". Pt reports that she has not taken anything today for HA. Pt c/o nausea but denies vomiting. Pt reports history of HAs but has not followed up. Pt states that HA is sharp and pounding and radiates from front of head to back of head. Pt c/o photophobia

## 2016-12-23 ENCOUNTER — Inpatient Hospital Stay
Admit: 2016-12-23 | Discharge: 2016-12-23 | Disposition: A | Discharge status: Home / Self Care | Payer: Self-pay | Attending: Emergency Medicine

## 2016-12-23 MED ORDER — SODIUM CHLORIDE 0.9% BOLUS IV
0.9 % | Freq: Once | INTRAVENOUS | Status: AC
Start: 2016-12-23 — End: 2016-12-22
  Administered 2016-12-23: 03:00:00 via INTRAVENOUS

## 2016-12-23 MED ORDER — DIPHENHYDRAMINE HCL 50 MG/ML IJ SOLN
50 mg/mL | Freq: Once | INTRAMUSCULAR | Status: AC
Start: 2016-12-23 — End: 2016-12-22
  Administered 2016-12-23: 03:00:00 via INTRAVENOUS

## 2016-12-23 MED ORDER — SODIUM CHLORIDE 0.9 % IJ SYRG
INTRAMUSCULAR | Status: DC | PRN
Start: 2016-12-23 — End: 2016-12-23

## 2016-12-23 MED ORDER — PROCHLORPERAZINE EDISYLATE 5 MG/ML INJECTION
5 mg/mL | INTRAMUSCULAR | Status: AC
Start: 2016-12-23 — End: 2016-12-22
  Administered 2016-12-23: 03:00:00 via INTRAVENOUS

## 2016-12-23 MED ORDER — SODIUM CHLORIDE 0.9 % IJ SYRG
Freq: Three times a day (TID) | INTRAMUSCULAR | Status: DC
Start: 2016-12-23 — End: 2016-12-23

## 2016-12-23 MED ORDER — KETOROLAC TROMETHAMINE 30 MG/ML INJECTION
30 mg/mL (1 mL) | INTRAMUSCULAR | Status: AC
Start: 2016-12-23 — End: 2016-12-22
  Administered 2016-12-23: 03:00:00 via INTRAVENOUS

## 2016-12-23 MED ORDER — CODEINE-BUTALBITAL-ASA-CAFFEINE 30 MG-50 MG-325 MG-40 MG CAP
30-50-325-40 mg | ORAL_CAPSULE | Freq: Four times a day (QID) | ORAL | 0 refills | Status: AC | PRN
Start: 2016-12-23 — End: ?

## 2016-12-23 MED FILL — DIPHENHYDRAMINE HCL 50 MG/ML IJ SOLN: 50 mg/mL | INTRAMUSCULAR | Qty: 1

## 2016-12-23 MED FILL — SODIUM CHLORIDE 0.9 % IV: INTRAVENOUS | Qty: 1000

## 2016-12-23 MED FILL — BD POSIFLUSH NORMAL SALINE 0.9 % INJECTION SYRINGE: INTRAMUSCULAR | Qty: 10

## 2016-12-23 MED FILL — KETOROLAC TROMETHAMINE 30 MG/ML INJECTION: 30 mg/mL (1 mL) | INTRAMUSCULAR | Qty: 1

## 2016-12-23 MED FILL — PROCHLORPERAZINE EDISYLATE 5 MG/ML INJECTION: 5 mg/mL | INTRAMUSCULAR | Qty: 2

## 2016-12-23 NOTE — ED Notes (Signed)
I have reviewed discharge instructions with the patient and spouse. Pt and pt's spouse instructed on medication usage and follow up instructions. The patient and spouse verbalized understanding. VSS upon discharge. Pt via wheelchair to lobby with spouse to drive home.  Patient armband removed and shredded

## 2017-09-24 ENCOUNTER — Emergency Department: Admit: 2017-09-25 | Payer: MEDICAID | Primary: Family Medicine

## 2017-09-24 DIAGNOSIS — J069 Acute upper respiratory infection, unspecified: Secondary | ICD-10-CM

## 2017-09-24 NOTE — ED Triage Notes (Signed)
Pt ambulatory into ER c/o headache, chills, body aches, fatigue, cough, sore throat, and nausea x 2 days.

## 2017-09-24 NOTE — ED Provider Notes (Signed)
EMERGENCY DEPARTMENT HISTORY AND PHYSICAL EXAM    Date: 09/24/2017  Patient Name: Candace Wheeler    History of Presenting Illness     Chief Complaint   Patient presents with   ??? Flu Like Symptoms         History Provided By: Patient    Chief Complaint: chest congestion cough headache   Duration: 2 Days  Timing:  Acute  Location: chest  Quality: congestion  Severity: Moderate  Modifying Factors: coughing  Associated Symptoms: nausea, cough, headache    Additional History (Context):   22:40  KHAMILLE BEYNON is a 57 y.o. female with PMHX of GERD who presents to the emergency department C/O cough, congestion epigastric discomfort. Associated sxs include headache. Pt denies fevers, vomiting or diarrhea, and any other sxs or complaints.   Patient presents with complaints of chest congestion cough headache for the past week.  Patient symptoms got progressively worse the cough is nonproductive.  She denies any fevers.  She has had severe nausea worse today associated with epigastric discomfort.  Denies vomiting or diarrhea.  She denies any fevers.  She has a fullness on her throat which she has had for the past week.  Feels like there is something pressing on her [place].    PCP: Other, Phys, MD    Current Outpatient Medications   Medication Sig Dispense Refill   ??? potassium chloride SA (MICRO-K) 10 mEq capsule Take 2 Caps by mouth daily for 5 days. 10 Cap 0   ??? albuterol (PROVENTIL HFA, VENTOLIN HFA, PROAIR HFA) 90 mcg/actuation inhaler Take 2 Puffs by inhalation every four (4) hours as needed for Wheezing. 1 Inhaler 0   ??? cetirizine (ZYRTEC) 10 mg tablet Take 1 Tab by mouth daily. 10 Tab 0   ??? esomeprazole (NEXIUM) 40 mg capsule Take 1 Cap by mouth daily for 20 days. 20 Cap 0   ??? levothyroxine (SYNTHROID) 88 mcg tablet Take 88 mcg by mouth Daily (before breakfast).     ??? codeine-butalbital-aspirin-caffeine (FIORINAL-CODEINE #3) 30-50-325-40 mg per capsule Take 1 Cap by mouth every six (6) hours as needed for  Headache. Max Daily Amount: 4 Caps. Maximum 6 capsules daily 20 Cap 0       Past History     Past Medical History:  Past Medical History:   Diagnosis Date   ??? Gastrointestinal disorder     GERD   ??? Hypothyroidism    ??? Psychiatric disorder     anxiety       Past Surgical History:  Past Surgical History:   Procedure Laterality Date   ??? HX APPENDECTOMY     ??? HX CHOLECYSTECTOMY     ??? HX GYN      hysterectomy   ??? HX TUBAL LIGATION         Family History:  History reviewed. No pertinent family history.    Social History:  Social History     Tobacco Use   ??? Smoking status: Current Every Day Smoker     Packs/day: 0.50   ??? Smokeless tobacco: Never Used   Substance Use Topics   ??? Alcohol use: No   ??? Drug use: No       Allergies:  No Known Allergies      Review of Systems   Review of Systems   Constitutional: Negative for chills and fever.   HENT: Positive for congestion. Negative for sore throat.    Respiratory: Positive for cough. Negative for shortness of breath.  Cardiovascular: Negative for chest pain and leg swelling.   Gastrointestinal: Positive for abdominal pain. Negative for diarrhea, nausea and vomiting.   Genitourinary: Negative for dysuria and flank pain.   Musculoskeletal: Negative for back pain and neck pain.   Skin: Negative for color change and rash.   All other systems reviewed and are negative.      Physical Exam     Vitals:    09/24/17 2154   BP: 134/53   Pulse: 66   Resp: 16   Temp: 98.3 ??F (36.8 ??C)   SpO2: 100%   Weight: 56.7 kg (125 lb)   Height: _0  (1.651 m)     Physical Exam   Constitutional: She is oriented to person, place, and time. She appears well-developed and well-nourished.   HENT:   Head: Normocephalic and atraumatic.   Right Ear: External ear normal.   Left Ear: External ear normal.   Nose: Nose normal.   Mouth/Throat: Oropharynx is clear and moist.   Bilateral nasal congestion   Eyes: Pupils are equal, round, and reactive to light. Conjunctivae and EOM are normal.    Neck: Normal range of motion. Neck supple. No JVD present. No tracheal deviation present.   Cardiovascular: Normal rate, regular rhythm, normal heart sounds and intact distal pulses. Exam reveals no gallop and no friction rub.   No murmur heard.  Pulmonary/Chest: Effort normal and breath sounds normal. No respiratory distress. She has no wheezes. She has no rales.   Abdominal: Soft. Bowel sounds are normal. She exhibits no distension and no mass. There is no tenderness. There is no rebound and no guarding.   Musculoskeletal: Normal range of motion. She exhibits no edema or tenderness.   Neurological: She is alert and oriented to person, place, and time. She has normal reflexes. No cranial nerve deficit.   CN II-XII intact, no facial droop or asymmetry; No pronator drift; finger-nose-finger intact; good grips and equal strength 5/5 bilateral upper and lower extremities; DTRs: 2+ upper and lower extremities, symmetric bilaterally; sensation is intact to light touch and position sense upper and lower extremities symmetric bilaterally; gait normal   Skin: Skin is warm and dry. No rash noted.   Psychiatric: She has a normal mood and affect. Her behavior is normal.   Nursing note and vitals reviewed.        Diagnostic Study Results     Labs -     Recent Results (from the past 12 hour(s))   URINALYSIS W/ RFLX MICROSCOPIC    Collection Time: 09/24/17  9:03 PM   Result Value Ref Range    Color YELLOW      Appearance CLEAR      Specific gravity 1.007 1.005 - 1.030      pH (UA) 7.5 5.0 - 8.0      Protein NEGATIVE  NEG mg/dL    Glucose NEGATIVE  NEG mg/dL    Ketone NEGATIVE  NEG mg/dL    Bilirubin NEGATIVE  NEG      Blood NEGATIVE  NEG      Urobilinogen 0.2 0.2 - 1.0 EU/dL    Nitrites NEGATIVE  NEG      Leukocyte Esterase NEGATIVE  NEG     INFLUENZA A & B AG (RAPID TEST)    Collection Time: 09/24/17 10:00 PM   Result Value Ref Range    Influenza A Antigen NEGATIVE  NEG      Influenza B Antigen NEGATIVE  NEG      EKG,  12 LEAD, INITIAL    Collection Time: 09/24/17 10:59 PM   Result Value Ref Range    Ventricular Rate 61 BPM    Atrial Rate 61 BPM    P-R Interval 184 ms    QRS Duration 94 ms    Q-T Interval 436 ms    QTC Calculation (Bezet) 438 ms    Calculated P Axis 71 degrees    Calculated R Axis 65 degrees    Calculated T Axis 70 degrees    Diagnosis       Normal sinus rhythm  Normal ECG  When compared with ECG of 23-May-2015 14:22,  No significant change was found     METABOLIC PANEL, COMPREHENSIVE    Collection Time: 09/24/17 11:00 PM   Result Value Ref Range    Sodium 143 136 - 145 mmol/L    Potassium 3.3 (L) 3.5 - 5.5 mmol/L    Chloride 110 (H) 100 - 108 mmol/L    CO2 28 21 - 32 mmol/L    Anion gap 5 3.0 - 18 mmol/L    Glucose 96 74 - 99 mg/dL    BUN 8 7.0 - 18 MG/DL    Creatinine 0.68 0.6 - 1.3 MG/DL    BUN/Creatinine ratio 12 12 - 20      GFR est AA >60 >60 ml/min/1.52m    GFR est non-AA >60 >60 ml/min/1.745m   Calcium 8.6 8.5 - 10.1 MG/DL    Bilirubin, total 0.3 0.2 - 1.0 MG/DL    ALT (SGPT) 17 13 - 56 U/L    AST (SGOT) 12 (L) 15 - 37 U/L    Alk. phosphatase 72 45 - 117 U/L    Protein, total 6.0 (L) 6.4 - 8.2 g/dL    Albumin 3.6 3.4 - 5.0 g/dL    Globulin 2.4 2.0 - 4.0 g/dL    A-G Ratio 1.5 0.8 - 1.7     NT-PRO BNP    Collection Time: 09/24/17 11:00 PM   Result Value Ref Range    NT pro-BNP 35 0 - 900 PG/ML   LIPASE    Collection Time: 09/24/17 11:00 PM   Result Value Ref Range    Lipase 258 73 - 393 U/L   CBC WITH AUTOMATED DIFF    Collection Time: 09/24/17 11:00 PM   Result Value Ref Range    WBC 6.1 4.6 - 13.2 K/uL    RBC 3.81 (L) 4.20 - 5.30 M/uL    HGB 12.0 12.0 - 16.0 g/dL    HCT 35.8 35.0 - 45.0 %    MCV 94.0 74.0 - 97.0 FL    MCH 31.5 24.0 - 34.0 PG    MCHC 33.5 31.0 - 37.0 g/dL    RDW 11.5 (L) 11.6 - 14.5 %    PLATELET 174 135 - 420 K/uL    MPV 9.5 9.2 - 11.8 FL    NEUTROPHILS 47 40 - 73 %    LYMPHOCYTES 41 21 - 52 %    MONOCYTES 7 3 - 10 %    EOSINOPHILS 5 0 - 5 %    BASOPHILS 0 0 - 2 %     ABS. NEUTROPHILS 2.8 1.8 - 8.0 K/UL    ABS. LYMPHOCYTES 2.5 0.9 - 3.6 K/UL    ABS. MONOCYTES 0.4 0.05 - 1.2 K/UL    ABS. EOSINOPHILS 0.3 0.0 - 0.4 K/UL    ABS. BASOPHILS 0.0 0.0 - 0.1 K/UL    DF AUTOMATED  CARDIAC PANEL,(CK, CKMB & TROPONIN)    Collection Time: 09/24/17 11:00 PM   Result Value Ref Range    CK 82 26 - 192 U/L    CK - MB 1.4 <3.6 ng/ml    CK-MB Index 1.7 0.0 - 4.0 %    Troponin-I, QT <0.02 0.0 - 0.045 NG/ML       Radiologic Studies -   XR NECK SOFT TISSUE   Final Result   Impression:   --------------      No significant abnormalities.      XR CHEST PA LAT   Final Result   Impression:   --------------      No active pulmonary disease.        CT Results  (Last 48 hours)    None        CXR Results  (Last 48 hours)    None          Medications given in the ED-  Medications   acetaminophen (TYLENOL) tablet 1,000 mg (1,000 mg Oral Given 09/24/17 2302)   potassium bicarbonate (KLYTE) tablet 25 mEq (25 mEq Oral Given 09/24/17 2349)         Medical Decision Making   I am the first provider for this patient.    I reviewed the vital signs, available nursing notes, past medical history, past surgical history, family history and social history.    Vital Signs-Reviewed the patient's vital signs.    Pulse Oximetry Analysis - 100% on RA     Cardiac Monitor:  Rate: 66 bpm  Rhythm: NSR    EKG interpretation: (Preliminary)  22:59  Sinus rhythm at 61 with normal intervals, normal axis and no ST-T wave changes suggestive of ischemia  EKG read by C. Kaydence Baba at 23:05     Records Reviewed: Nursing Notes    Provider Notes (Medical Decision Making): DDx- pneumonia, influenza, URI, COPD, asthma, CHF    Procedures:  Procedures    ED Course:   Initial assessment performed. The patients presenting problems have been discussed, and they are in agreement with the care plan formulated and outlined with them.  I have encouraged them to ask questions as they arise throughout their visit.    Diagnosis and Disposition   Discussion:   The patient was stable in the emergency department.  Patient was given Tylenol with improvement and resolution of her headache.  ECG and troponin showed no evidence of acute coronary syndrome.  Chest x-ray was unremarkable there was no evidence of CHF or pneumonia.  Soft tissue neck was unremarkable.  There is no evidence of epiglottitis or airway swelling.  Labs were remarkable for hypokalemia the patient was given potassium replacement.  Lipase was normal.  BNP was normal.  The cause of the patient's symptoms are likely secondary to an upper respiratory infection and GERD.  The patient has a history of GERD and she will continue her PPI.  Patient was advised to follow-up with her PCP.  Patient advised to return to the emergency department if she develops progressive symptoms, shortness of breath, fevers or vomiting.  Patient was advised to stop smoking cigarettes.    DISCHARGE NOTE:  1:38AM  Angeleah A Gorr's  results have been reviewed with her.  She has been counseled regarding her diagnosis, treatment, and plan.  She verbally conveys understanding and agreement of the signs, symptoms, diagnosis, treatment and prognosis and additionally agrees to follow up as discussed.  She also agrees with the care-plan and conveys that all  of her questions have been answered.  I have also provided discharge instructions for her that include: educational information regarding their diagnosis and treatment, and list of reasons why they would want to return to the ED prior to their follow-up appointment, should her condition change. She has been provided with education for proper emergency department utilization.     CLINICAL IMPRESSION:    1. Cough    2. Chest congestion    3. Nonintractable headache, unspecified chronicity pattern, unspecified headache type    4. Hypokalemia    5. Tobacco use disorder    6. Acute upper respiratory infection    7. Gastroesophageal reflux disease without esophagitis        PLAN:  1. D/C Home  2.    Discharge Medication List as of 09/25/2017  1:38 AM      START taking these medications    Details   potassium chloride SA (MICRO-K) 10 mEq capsule Take 2 Caps by mouth daily for 5 days., Print, Disp-10 Cap, R-0      albuterol (PROVENTIL HFA, VENTOLIN HFA, PROAIR HFA) 90 mcg/actuation inhaler Take 2 Puffs by inhalation every four (4) hours as needed for Wheezing., Print, Disp-1 Inhaler, R-0      cetirizine (ZYRTEC) 10 mg tablet Take 1 Tab by mouth daily., Print, Disp-10 Tab, R-0         CONTINUE these medications which have CHANGED    Details   esomeprazole (NEXIUM) 40 mg capsule Take 1 Cap by mouth daily for 20 days., Print, Disp-20 Cap, R-0         CONTINUE these medications which have NOT CHANGED    Details   levothyroxine (SYNTHROID) 88 mcg tablet Take 88 mcg by mouth Daily (before breakfast)., Historical Med      codeine-butalbital-aspirin-caffeine (FIORINAL-CODEINE #3) 30-50-325-40 mg per capsule Take 1 Cap by mouth every six (6) hours as needed for Headache. Max Daily Amount: 4 Caps. Maximum 6 capsules daily, Print, Disp-20 Cap, R-0           3.   Follow-up Information     Follow up With Specialties Details Why Contact Info    Putland, Isa Rankin, MD Family Practice Schedule an appointment as soon as possible for a visit in 1 day For further evaluation [id number]  [place] [address]  [phone]          This note was partially transcribed via voice recognition software. Although efforts have been made to catch any discrepancies, it may contain sound alike words, grammatical errors, or nonsensical words.

## 2017-09-25 ENCOUNTER — Inpatient Hospital Stay
Admit: 2017-09-25 | Discharge: 2017-09-25 | Disposition: A | Discharge status: Home / Self Care | Payer: MEDICAID | Attending: Emergency Medicine

## 2017-09-25 LAB — CBC WITH AUTOMATED DIFF
ABS. BASOPHILS: 0 10*3/uL (ref 0.0–0.1)
ABS. EOSINOPHILS: 0.3 10*3/uL (ref 0.0–0.4)
ABS. LYMPHOCYTES: 2.5 10*3/uL (ref 0.9–3.6)
ABS. MONOCYTES: 0.4 10*3/uL (ref 0.05–1.2)
ABS. NEUTROPHILS: 2.8 10*3/uL (ref 1.8–8.0)
BASOPHILS: 0 % (ref 0–2)
EOSINOPHILS: 5 % (ref 0–5)
HCT: 35.8 % (ref 35.0–45.0)
HGB: 12 g/dL (ref 12.0–16.0)
LYMPHOCYTES: 41 % (ref 21–52)
MCH: 31.5 PG (ref 24.0–34.0)
MCHC: 33.5 g/dL (ref 31.0–37.0)
MCV: 94 FL (ref 74.0–97.0)
MONOCYTES: 7 % (ref 3–10)
MPV: 9.5 FL (ref 9.2–11.8)
NEUTROPHILS: 47 % (ref 40–73)
PLATELET: 174 10*3/uL (ref 135–420)
RBC: 3.81 M/uL — ABNORMAL LOW (ref 4.20–5.30)
RDW: 11.5 % — ABNORMAL LOW (ref 11.6–14.5)
WBC: 6.1 10*3/uL (ref 4.6–13.2)

## 2017-09-25 LAB — METABOLIC PANEL, COMPREHENSIVE
A-G Ratio: 1.5 (ref 0.8–1.7)
ALT (SGPT): 17 U/L (ref 13–56)
AST (SGOT): 12 U/L — ABNORMAL LOW (ref 15–37)
Albumin: 3.6 g/dL (ref 3.4–5.0)
Alk. phosphatase: 72 U/L (ref 45–117)
Anion gap: 5 mmol/L (ref 3.0–18)
BUN/Creatinine ratio: 12 (ref 12–20)
BUN: 8 MG/DL (ref 7.0–18)
Bilirubin, total: 0.3 MG/DL (ref 0.2–1.0)
CO2: 28 mmol/L (ref 21–32)
Calcium: 8.6 MG/DL (ref 8.5–10.1)
Chloride: 110 mmol/L — ABNORMAL HIGH (ref 100–108)
Creatinine: 0.68 MG/DL (ref 0.6–1.3)
GFR est AA: >60 mL/min/{1.73_m2} (ref >60)
GFR est non-AA: >60 mL/min/{1.73_m2} (ref >60)
Globulin: 2.4 g/dL (ref 2.0–4.0)
Glucose: 96 mg/dL (ref 74–99)
Potassium: 3.3 mmol/L — ABNORMAL LOW (ref 3.5–5.5)
Protein, total: 6 g/dL — ABNORMAL LOW (ref 6.4–8.2)
Sodium: 143 mmol/L (ref 136–145)

## 2017-09-25 LAB — URINALYSIS W/ RFLX MICROSCOPIC
Bilirubin: NEGATIVE
Blood: NEGATIVE
Glucose: NEGATIVE mg/dL
Ketone: NEGATIVE mg/dL
Leukocyte Esterase: NEGATIVE
Nitrites: NEGATIVE
Protein: NEGATIVE mg/dL
Specific gravity: 1.007 (ref 1.005–1.030)
Urobilinogen: 0.2 EU/dL (ref 0.2–1.0)
pH (UA): 7.5 (ref 5.0–8.0)

## 2017-09-25 LAB — CARDIAC PANEL,(CK, CKMB & TROPONIN)
CK - MB: 1.4 ng/ml (ref <3.6)
CK-MB Index: 1.7 % (ref 0.0–4.0)
CK: 82 U/L (ref 26–192)
Troponin-I, QT: <0.02 NG/ML (ref 0.0–0.045)

## 2017-09-25 LAB — NT-PRO BNP: NT pro-BNP: 35 PG/ML (ref 0–900)

## 2017-09-25 LAB — INFLUENZA A & B AG (RAPID TEST)
Influenza A Antigen: NEGATIVE
Influenza B Antigen: NEGATIVE

## 2017-09-25 LAB — LIPASE: Lipase: 258 U/L (ref 73–393)

## 2017-09-25 MED ORDER — ACETAMINOPHEN 500 MG TAB
500 mg | Freq: Once | ORAL | Status: AC
Start: 2017-09-25 — End: 2017-09-24
  Administered 2017-09-25: 03:00:00 via ORAL

## 2017-09-25 MED ORDER — CETIRIZINE 10 MG TAB
10 mg | ORAL_TABLET | Freq: Every day | ORAL | 0 refills | Status: AC
Start: 2017-09-25 — End: ?

## 2017-09-25 MED ORDER — POTASSIUM BICARBONATE-CITRIC ACID 25 MEQ EFFERVESCENT TAB
25 mEq | ORAL | Status: AC
Start: 2017-09-25 — End: 2017-09-24
  Administered 2017-09-25: 04:00:00 via ORAL

## 2017-09-25 MED ORDER — POTASSIUM CHLORIDE SR 10 MEQ CAP
10 mEq | ORAL_CAPSULE | Freq: Every day | ORAL | 0 refills | Status: AC
Start: 2017-09-25 — End: 2017-09-30

## 2017-09-25 MED ORDER — ALBUTEROL SULFATE HFA 90 MCG/ACTUATION AEROSOL INHALER
90 mcg/actuation | RESPIRATORY_TRACT | 0 refills | Status: AC | PRN
Start: 2017-09-25 — End: ?

## 2017-09-25 MED ORDER — ESOMEPRAZOLE MAGNESIUM 40 MG CAP, DELAYED RELEASE
40 mg | ORAL_CAPSULE | Freq: Every day | ORAL | 0 refills | Status: AC
Start: 2017-09-25 — End: 2017-10-15

## 2017-09-25 MED FILL — K-EFFERVESCENT 25 MEQ TABLET: 25 mEq | ORAL | Qty: 1

## 2017-09-25 MED FILL — MAPAP EXTRA STRENGTH 500 MG TABLET: 500 mg | ORAL | Qty: 2

## 2017-09-25 NOTE — ED Notes (Signed)
I have reviewed discharge instructions with the patient.  The patient verbalized understanding.  Patient armband removed and shredded  Patient d/c home in stable condition, ambulatory. No distress noted.

## 2017-11-18 LAB — EKG 12-LEAD
Atrial Rate: 61 {beats}/min
Diagnosis: NORMAL
P Axis: 71 degrees
P-R Interval: 184 ms
Q-T Interval: 436 ms
QRS Duration: 94 ms
QTc Calculation (Bazett): 438 ms
R Axis: 65 degrees
T Axis: 70 degrees
Ventricular Rate: 61 {beats}/min

## 2017-11-18 LAB — EKG, 12 LEAD, INITIAL
Atrial Rate: 61 {beats}/min
Calculated P Axis: 71 degrees
Calculated R Axis: 65 degrees
Calculated T Axis: 70 degrees
Diagnosis: NORMAL
P-R Interval: 184 ms
Q-T Interval: 436 ms
QRS Duration: 94 ms
QTC Calculation (Bezet): 438 ms
Ventricular Rate: 61 {beats}/min

## 2019-05-24 DIAGNOSIS — Z634 Disappearance and death of family member: Secondary | ICD-10-CM

## 2019-05-24 NOTE — ED Provider Notes (Signed)
EMERGENCY DEPARTMENT HISTORY AND PHYSICAL EXAM    Date: 05/24/2019  Patient Name: Candace Wheeler    History of Presenting Illness     Chief Complaint   Patient presents with   ??? Depression         History Provided By: Patient    Additional History (Context):   Candace DecemberCarol A Urquiza is a 58 y.o. female with PMHX anxiety, GERD presents to the emergency department via private vehicle C/O increasing anxiety, insomnia since her son died 3 weeks ago.  Patient reports that she went out of town for the last 3 weeks however has been having increasing anxiety and difficulty sleeping when she returned home.  States "everything reminds me of him".  She states that she does not usually take anything for anxiety.  States that she feels nauseated because of her anxiety.  Does not have a mental health provider.  Pt denies SI, HI and states "I would never do anything like that", and any other sxs or complaints.  Does not want inpatient psychiatric admission    PCP: Putland, Darcella GasmanKenneth W, MD    Current Outpatient Medications   Medication Sig Dispense Refill   ??? hydrOXYzine HCL (ATARAX) 50 mg tablet Take 1 Tab by mouth every six (6) hours as needed for Anxiety or Sleep for up to 10 days. 20 Tab 0   ??? ondansetron (Zofran ODT) 4 mg disintegrating tablet Take 1 Tab by mouth every eight (8) hours as needed for Nausea or Vomiting. 12 Tab 0   ??? albuterol (PROVENTIL HFA, VENTOLIN HFA, PROAIR HFA) 90 mcg/actuation inhaler Take 2 Puffs by inhalation every four (4) hours as needed for Wheezing. 1 Inhaler 0   ??? cetirizine (ZYRTEC) 10 mg tablet Take 1 Tab by mouth daily. 10 Tab 0   ??? codeine-butalbital-aspirin-caffeine (FIORINAL-CODEINE #3) 30-50-325-40 mg per capsule Take 1 Cap by mouth every six (6) hours as needed for Headache. Max Daily Amount: 4 Caps. Maximum 6 capsules daily 20 Cap 0   ??? levothyroxine (SYNTHROID) 88 mcg tablet Take 88 mcg by mouth Daily (before breakfast).         Past History     Past Medical History:  Past Medical History:    Diagnosis Date   ??? Gastrointestinal disorder     GERD   ??? Hypothyroidism    ??? Psychiatric disorder     anxiety       Past Surgical History:  Past Surgical History:   Procedure Laterality Date   ??? HX APPENDECTOMY     ??? HX CHOLECYSTECTOMY     ??? HX GYN      hysterectomy   ??? HX TUBAL LIGATION         Family History:  History reviewed. No pertinent family history.    Social History:  Social History     Tobacco Use   ??? Smoking status: Current Every Day Smoker     Packs/day: 0.50   ??? Smokeless tobacco: Never Used   Substance Use Topics   ??? Alcohol use: No   ??? Drug use: No       Allergies:  No Known Allergies      Review of Systems   Review of Systems   Constitutional: Negative for chills and fever.   HENT: Negative for congestion, ear pain, sinus pain and sore throat.    Eyes: Negative for pain and visual disturbance.   Respiratory: Negative for cough and shortness of breath.    Cardiovascular: Negative for chest  pain and leg swelling.   Gastrointestinal: Negative for abdominal pain, constipation, diarrhea, nausea and vomiting.   Genitourinary: Negative for dysuria, hematuria, vaginal bleeding and vaginal discharge.   Musculoskeletal: Negative for back pain and neck pain.   Skin: Negative for rash and wound.   Neurological: Negative for dizziness, tremors, weakness, light-headedness and numbness.   Psychiatric/Behavioral: Positive for decreased concentration and sleep disturbance. Negative for agitation, behavioral problems, self-injury and suicidal ideas. The patient is nervous/anxious.    All other systems reviewed and are negative.      Physical Exam     Vitals:    05/24/19 2038   BP: 139/76   Pulse: 98   Resp: 20   Temp: 98.2 ??F (36.8 ??C)   SpO2: 100%   Weight: 56.7 kg (125 lb)   Height: 5' 5.5" (1.664 m)     Physical Exam    Nursing note and vitals reviewed    Constitutional: Tearful and anxious, nontoxic  Head: Normocephalic, Atraumatic  Eyes: Pupils are equal, round, and reactive to light, EOMI   Neck: Supple, non-tender  Cardiovascular: Regular rate and rhythm, no murmurs, rubs, or gallops, + 2 radial pulses bilaterally  Chest: Normal work of breathing and chest excursion bilaterally  Lungs: Clear to ausculation bilaterally, no wheezes, no rhonchi  Abdomen: Soft, non tender, non distended, normoactive bowel sounds  Back: No evidence of trauma or deformity  Extremities: No evidence of trauma or deformity, no LE edema. No streaking erythema, vesicular lesions, ulcerations or bulla  Skin: Warm and dry, normal cap refill  Neuro: Alert and appropriate, CN intact, normal speech, moving all 4 extremities freely and symmetrically  Psychiatric: Tearful and anxious but cooperative       Diagnostic Study Results     Labs -   No results found for this or any previous visit (from the past 12 hour(s)).    Radiologic Studies -   No orders to display     CT Results  (Last 48 hours)    None        CXR Results  (Last 48 hours)    None            Medical Decision Making   I am the first provider for this patient.    I reviewed the vital signs, available nursing notes, past medical history, past surgical history, family history and social history.    Vital Signs-Reviewed the patient's vital signs.    Pulse Oximetry Analysis -100% on room air    Cardiac Monitor:  Rate: 98 bpm  Rhythm: Regular    Records Reviewed: Nursing Notes and Old Medical Records    Provider Notes:   58 y.o. female presenting with anxiety, insomnia after the loss of her son 3 weeks ago.  Denies SI or HI.  On exam patient is afebrile with appropriate vital signs.  She is tearful and anxious but nontoxic.  Patient does not meet or want inpatient psychiatric criteria and no indication for TDO as patient denies any SI or HI.  Patient with understandable grief response.  Will discharge with hydroxyzine and Zofran for symptom control.  Will consult life coach to help establish mental health provider.    Procedures:  Procedures    ED Course:   8:46 PM    Initial assessment performed. The patients presenting problems have been discussed, and they are in agreement with the care plan formulated and outlined with them.  I have encouraged them to ask questions as they arise  throughout their visit.         Diagnosis and Disposition       DISCHARGE NOTE:  9:08 PM    Danesha A Delisi's  results have been reviewed with her.  She has been counseled regarding her diagnosis, treatment, and plan.  She verbally conveys understanding and agreement of the signs, symptoms, diagnosis, treatment and prognosis and additionally agrees to follow up as discussed.  She also agrees with the care-plan and conveys that all of her questions have been answered.  I have also provided discharge instructions for her that include: educational information regarding their diagnosis and treatment, and list of reasons why they would want to return to the ED prior to their follow-up appointment, should her condition change. She has been provided with education for proper emergency department utilization.     CLINICAL IMPRESSION:    1. Grief at loss of child    2. Adjustment insomnia    3. Nausea without vomiting        PLAN:  1. D/C Home  2.   Current Discharge Medication List      START taking these medications    Details   hydrOXYzine HCL (ATARAX) 50 mg tablet Take 1 Tab by mouth every six (6) hours as needed for Anxiety or Sleep for up to 10 days.  Qty: 20 Tab, Refills: 0      ondansetron (Zofran ODT) 4 mg disintegrating tablet Take 1 Tab by mouth every eight (8) hours as needed for Nausea or Vomiting.  Qty: 12 Tab, Refills: 0           3.   Follow-up Information     Follow up With Specialties Details Why Contact Info    [facility]  Schedule an appointment as soon as possible for a visit in 2 days  [address] [place] [address]  [place] [address]  [phone]        ____________________________________     Please note that this dictation was completed with Dragon, the computer  voice recognition software.  Quite often unanticipated grammatical, syntax, homophones, and other interpretive errors are inadvertently transcribed by the computer software.  Please disregard these errors.  Please excuse any errors that have escaped final proofreading.

## 2019-05-24 NOTE — ED Notes (Signed)
I told pt I had here d/c papers. Pt became very upset and stated that we hadn't dome anything, she states she wasn't sleeping or eating and  She's dehydrated. I explained that the doctor had prescriptions for her to help with sleep. Pt stated all the pharmacies are closed. I explained that [facility] was open 24 hours a day every day, I also explained that at present all her vital signs looked good. Additionally I offered to have Dr Becker come and speak to her. Pt deferred, was calmer and apologized for snapping at me.Pt given prescription with verbal medication information Pt given verbal and written d/c instructions. pt ambulated from ED in NAD

## 2019-05-24 NOTE — ED Provider Notes (Signed)
EMERGENCY DEPARTMENT HISTORY AND PHYSICAL EXAM    Date: 05/24/2019  Patient Name: Candace Wheeler    History of Presenting Illness     Chief Complaint   Patient presents with   ??? Depression         History Provided By: Patient    Additional History (Context):   Candace Wheeler is a 58 y.o. female with PMHX anxiety, GERD presents to the emergency department via private vehicle C/O increasing anxiety, insomnia since her son died 3 weeks ago.  Patient reports that she went out of town for the last 3 weeks however has been having increasing anxiety and difficulty sleeping when she returned home.  States "everything reminds me of him".  She states that she does not usually take anything for anxiety.  States that she feels nauseated because of her anxiety.  Does not have a mental health provider.  Pt denies SI, HI and states "I would never do anything like that", and any other sxs or complaints.  Does not want inpatient psychiatric admission    PCP: Putland, Darcella Gasman, MD    Current Outpatient Medications   Medication Sig Dispense Refill   ??? hydrOXYzine HCL (ATARAX) 50 mg tablet Take 1 Tab by mouth every six (6) hours as needed for Anxiety or Sleep for up to 10 days. 20 Tab 0   ??? ondansetron (Zofran ODT) 4 mg disintegrating tablet Take 1 Tab by mouth every eight (8) hours as needed for Nausea or Vomiting. 12 Tab 0   ??? albuterol (PROVENTIL HFA, VENTOLIN HFA, PROAIR HFA) 90 mcg/actuation inhaler Take 2 Puffs by inhalation every four (4) hours as needed for Wheezing. 1 Inhaler 0   ??? cetirizine (ZYRTEC) 10 mg tablet Take 1 Tab by mouth daily. 10 Tab 0   ??? codeine-butalbital-aspirin-caffeine (FIORINAL-CODEINE #3) 30-50-325-40 mg per capsule Take 1 Cap by mouth every six (6) hours as needed for Headache. Max Daily Amount: 4 Caps. Maximum 6 capsules daily 20 Cap 0   ??? levothyroxine (SYNTHROID) 88 mcg tablet Take 88 mcg by mouth Daily (before breakfast).         Past History     Past Medical History:  Past Medical History:   Diagnosis  Date   ??? Gastrointestinal disorder     GERD   ??? Hypothyroidism    ??? Psychiatric disorder     anxiety       Past Surgical History:  Past Surgical History:   Procedure Laterality Date   ??? HX APPENDECTOMY     ??? HX CHOLECYSTECTOMY     ??? HX GYN      hysterectomy   ??? HX TUBAL LIGATION         Family History:  History reviewed. No pertinent family history.    Social History:  Social History     Tobacco Use   ??? Smoking status: Current Every Day Smoker     Packs/day: 0.50   ??? Smokeless tobacco: Never Used   Substance Use Topics   ??? Alcohol use: No   ??? Drug use: No       Allergies:  No Known Allergies      Review of Systems   Review of Systems   Constitutional: Negative for chills and fever.   HENT: Negative for congestion, ear pain, sinus pain and sore throat.    Eyes: Negative for pain and visual disturbance.   Respiratory: Negative for cough and shortness of breath.    Cardiovascular: Negative for chest  pain and leg swelling.   Gastrointestinal: Negative for abdominal pain, constipation, diarrhea, nausea and vomiting.   Genitourinary: Negative for dysuria, hematuria, vaginal bleeding and vaginal discharge.   Musculoskeletal: Negative for back pain and neck pain.   Skin: Negative for rash and wound.   Neurological: Negative for dizziness, tremors, weakness, light-headedness and numbness.   Psychiatric/Behavioral: Positive for decreased concentration and sleep disturbance. Negative for agitation, behavioral problems, self-injury and suicidal ideas. The patient is nervous/anxious.    All other systems reviewed and are negative.      Physical Exam     Vitals:    05/24/19 2038   BP: 139/76   Pulse: 98   Resp: 20   Temp: 98.2 ??F (36.8 ??C)   SpO2: 100%   Weight: 56.7 kg (125 lb)   Height: 5' 5.5" (1.664 m)     Physical Exam    Nursing note and vitals reviewed    Constitutional: Tearful and anxious, nontoxic  Head: Normocephalic, Atraumatic  Eyes: Pupils are equal, round, and reactive to light, EOMI  Neck: Supple,  non-tender  Cardiovascular: Regular rate and rhythm, no murmurs, rubs, or gallops, + 2 radial pulses bilaterally  Chest: Normal work of breathing and chest excursion bilaterally  Lungs: Clear to ausculation bilaterally, no wheezes, no rhonchi  Abdomen: Soft, non tender, non distended, normoactive bowel sounds  Back: No evidence of trauma or deformity  Extremities: No evidence of trauma or deformity, no LE edema. No streaking erythema, vesicular lesions, ulcerations or bulla  Skin: Warm and dry, normal cap refill  Neuro: Alert and appropriate, CN intact, normal speech, moving all 4 extremities freely and symmetrically  Psychiatric: Tearful and anxious but cooperative       Diagnostic Study Results     Labs -   No results found for this or any previous visit (from the past 12 hour(s)).    Radiologic Studies -   No orders to display     CT Results  (Last 48 hours)    None        CXR Results  (Last 48 hours)    None            Medical Decision Making   I am the first provider for this patient.    I reviewed the vital signs, available nursing notes, past medical history, past surgical history, family history and social history.    Vital Signs-Reviewed the patient's vital signs.    Pulse Oximetry Analysis -100% on room air    Cardiac Monitor:  Rate: 98 bpm  Rhythm: Regular    Records Reviewed: Nursing Notes and Old Medical Records    Provider Notes:   58 y.o. female presenting with anxiety, insomnia after the loss of her son 3 weeks ago.  Denies SI or HI.  On exam patient is afebrile with appropriate vital signs.  She is tearful and anxious but nontoxic.  Patient does not meet or want inpatient psychiatric criteria and no indication for TDO as patient denies any SI or HI.  Patient with understandable grief response.  Will discharge with hydroxyzine and Zofran for symptom control.  Will consult life coach to help establish mental health provider.    Procedures:  Procedures    ED Course:   8:46 PM   Initial assessment  performed. The patients presenting problems have been discussed, and they are in agreement with the care plan formulated and outlined with them.  I have encouraged them to ask questions as they arise  throughout their visit.         Diagnosis and Disposition       DISCHARGE NOTE:  9:08 PM    Makari A Hunley's  results have been reviewed with her.  She has been counseled regarding her diagnosis, treatment, and plan.  She verbally conveys understanding and agreement of the signs, symptoms, diagnosis, treatment and prognosis and additionally agrees to follow up as discussed.  She also agrees with the care-plan and conveys that all of her questions have been answered.  I have also provided discharge instructions for her that include: educational information regarding their diagnosis and treatment, and list of reasons why they would want to return to the ED prior to their follow-up appointment, should her condition change. She has been provided with education for proper emergency department utilization.     CLINICAL IMPRESSION:    1. Grief at loss of child    2. Adjustment insomnia    3. Nausea without vomiting        PLAN:  1. D/C Home  2.   Current Discharge Medication List      START taking these medications    Details   hydrOXYzine HCL (ATARAX) 50 mg tablet Take 1 Tab by mouth every six (6) hours as needed for Anxiety or Sleep for up to 10 days.  Qty: 20 Tab, Refills: 0      ondansetron (Zofran ODT) 4 mg disintegrating tablet Take 1 Tab by mouth every eight (8) hours as needed for Nausea or Vomiting.  Qty: 12 Tab, Refills: 0           3.   Follow-up Information     Follow up With Specialties Details Why Contact Info    [facility]  Schedule an appointment as soon as possible for a visit in 2 days  [address]  [place], [place] [id number]  [place] [id number]  [phone]        ____________________________________     Please note that this dictation was completed with Dragon, the computer voice recognition  software.  Quite often unanticipated grammatical, syntax, homophones, and other interpretive errors are inadvertently transcribed by the computer software.  Please disregard these errors.  Please excuse any errors that have escaped final proofreading.

## 2019-05-24 NOTE — ED Notes (Signed)
I told pt I had here d/c papers. Pt became very upset and stated that we hadn't dome anything, she states she wasn't sleeping or eating and  She's dehydrated. I explained that the doctor had prescriptions for her to help with sleep. Pt stated all the pharmacies are closed. I explained that [facility] was open 24 hours a day every day, I also explained that at present all her vital signs looked good. Additionally I offered to have Dr Donalee Citrin come and speak to her. Pt deferred, was calmer and apologized for snapping at me.Pt given prescription with verbal medication information Pt given verbal and written d/c instructions. pt ambulated from ED in NAD

## 2019-05-25 ENCOUNTER — Inpatient Hospital Stay
Admit: 2019-05-25 | Discharge: 2019-05-25 | Disposition: A | Discharge status: Home / Self Care | Attending: Emergency Medicine

## 2019-05-25 MED ORDER — HYDROXYZINE 50 MG TAB
50 mg | ORAL_TABLET | Freq: Four times a day (QID) | ORAL | 0 refills | Status: AC | PRN
Start: 2019-05-25 — End: 2019-06-03

## 2019-05-25 MED ORDER — ONDANSETRON 4 MG TAB, RAPID DISSOLVE
4 mg | ORAL_TABLET | Freq: Three times a day (TID) | ORAL | 0 refills | Status: AC | PRN
Start: 2019-05-25 — End: ?
